# Patient Record
Sex: Female | Born: 1985 | Race: Black or African American | Hispanic: No | Marital: Single | Smoking: Current every day smoker
Health system: Southern US, Community
[De-identification: ages and names within clinical notes are randomized; demographics above are authoritative.]

## PROBLEM LIST (undated history)

## (undated) ENCOUNTER — Inpatient Hospital Stay (HOSPITAL_COMMUNITY): Payer: Self-pay

## (undated) VITALS — BP 114/73 | HR 89 | Temp 97.1°F | Resp 16 | Ht 62.0 in | Wt 192.0 lb

## (undated) DIAGNOSIS — F1020 Alcohol dependence, uncomplicated: Secondary | ICD-10-CM

## (undated) DIAGNOSIS — G43909 Migraine, unspecified, not intractable, without status migrainosus: Secondary | ICD-10-CM

## (undated) DIAGNOSIS — N12 Tubulo-interstitial nephritis, not specified as acute or chronic: Secondary | ICD-10-CM

## (undated) DIAGNOSIS — J45909 Unspecified asthma, uncomplicated: Secondary | ICD-10-CM

## (undated) DIAGNOSIS — F332 Major depressive disorder, recurrent severe without psychotic features: Secondary | ICD-10-CM

## (undated) DIAGNOSIS — R51 Headache: Secondary | ICD-10-CM

## (undated) DIAGNOSIS — B029 Zoster without complications: Secondary | ICD-10-CM

## (undated) DIAGNOSIS — F1220 Cannabis dependence, uncomplicated: Secondary | ICD-10-CM

## (undated) DIAGNOSIS — F419 Anxiety disorder, unspecified: Secondary | ICD-10-CM

## (undated) HISTORY — PX: TEAR DUCT PROBING: SHX793

## (undated) HISTORY — DX: Headache: R51

---

## 2007-01-18 ENCOUNTER — Encounter: Admission: RE | Admit: 2007-01-18 | Discharge: 2007-01-18 | Payer: Self-pay | Admitting: Family Medicine

## 2007-02-07 ENCOUNTER — Emergency Department (HOSPITAL_COMMUNITY): Admission: EM | Admit: 2007-02-07 | Discharge: 2007-02-07 | Payer: Self-pay | Admitting: Family Medicine

## 2008-10-14 ENCOUNTER — Emergency Department (HOSPITAL_COMMUNITY): Admission: EM | Admit: 2008-10-14 | Discharge: 2008-10-15 | Payer: Self-pay | Admitting: Emergency Medicine

## 2010-06-12 LAB — URINALYSIS, ROUTINE W REFLEX MICROSCOPIC
Nitrite: NEGATIVE
Specific Gravity, Urine: 1.034 — ABNORMAL HIGH (ref 1.005–1.030)
pH: 5.5 (ref 5.0–8.0)

## 2010-06-12 LAB — URINE MICROSCOPIC-ADD ON

## 2010-06-12 LAB — URINE CULTURE: Colony Count: 35000

## 2010-07-31 ENCOUNTER — Emergency Department (INDEPENDENT_AMBULATORY_CARE_PROVIDER_SITE_OTHER): Payer: BC Managed Care – PPO

## 2010-07-31 ENCOUNTER — Emergency Department (HOSPITAL_BASED_OUTPATIENT_CLINIC_OR_DEPARTMENT_OTHER)
Admission: EM | Admit: 2010-07-31 | Discharge: 2010-07-31 | Disposition: A | Discharge status: Home / Self Care | Payer: BC Managed Care – PPO | Attending: Emergency Medicine | Admitting: Emergency Medicine

## 2010-07-31 DIAGNOSIS — R0602 Shortness of breath: Secondary | ICD-10-CM

## 2010-07-31 DIAGNOSIS — J45909 Unspecified asthma, uncomplicated: Secondary | ICD-10-CM | POA: Insufficient documentation

## 2010-07-31 DIAGNOSIS — R059 Cough, unspecified: Secondary | ICD-10-CM | POA: Insufficient documentation

## 2010-07-31 DIAGNOSIS — R05 Cough: Secondary | ICD-10-CM | POA: Insufficient documentation

## 2011-08-24 ENCOUNTER — Encounter (HOSPITAL_COMMUNITY): Payer: Self-pay

## 2011-08-24 ENCOUNTER — Inpatient Hospital Stay (HOSPITAL_COMMUNITY)
Admission: AD | Admit: 2011-08-24 | Discharge: 2011-08-24 | Disposition: A | Discharge status: Home / Self Care | Payer: 59 | Source: Ambulatory Visit | Attending: Obstetrics & Gynecology | Admitting: Obstetrics & Gynecology

## 2011-08-24 DIAGNOSIS — Z3202 Encounter for pregnancy test, result negative: Secondary | ICD-10-CM

## 2011-08-24 DIAGNOSIS — N760 Acute vaginitis: Secondary | ICD-10-CM

## 2011-08-24 DIAGNOSIS — O209 Hemorrhage in early pregnancy, unspecified: Secondary | ICD-10-CM

## 2011-08-24 DIAGNOSIS — N949 Unspecified condition associated with female genital organs and menstrual cycle: Secondary | ICD-10-CM | POA: Insufficient documentation

## 2011-08-24 DIAGNOSIS — R109 Unspecified abdominal pain: Secondary | ICD-10-CM | POA: Insufficient documentation

## 2011-08-24 DIAGNOSIS — B9689 Other specified bacterial agents as the cause of diseases classified elsewhere: Secondary | ICD-10-CM

## 2011-08-24 DIAGNOSIS — A499 Bacterial infection, unspecified: Secondary | ICD-10-CM | POA: Insufficient documentation

## 2011-08-24 DIAGNOSIS — N72 Inflammatory disease of cervix uteri: Secondary | ICD-10-CM | POA: Insufficient documentation

## 2011-08-24 HISTORY — DX: Zoster without complications: B02.9

## 2011-08-24 HISTORY — DX: Unspecified asthma, uncomplicated: J45.909

## 2011-08-24 HISTORY — DX: Anxiety disorder, unspecified: F41.9

## 2011-08-24 LAB — URINALYSIS, ROUTINE W REFLEX MICROSCOPIC
Bilirubin Urine: NEGATIVE
Ketones, ur: 15 mg/dL — AB
Leukocytes, UA: NEGATIVE
Protein, ur: 30 mg/dL — AB
Specific Gravity, Urine: >1.03 — ABNORMAL HIGH (ref 1.005–1.030)

## 2011-08-24 LAB — URINE MICROSCOPIC-ADD ON

## 2011-08-24 LAB — WET PREP, GENITAL: Trich, Wet Prep: NONE SEEN

## 2011-08-24 LAB — POCT PREGNANCY, URINE: Preg Test, Ur: NEGATIVE

## 2011-08-24 MED ORDER — METRONIDAZOLE 500 MG PO TABS: 500.0000 mg | ORAL_TABLET | Freq: Two times a day (BID) | ORAL | Status: AC

## 2011-08-24 MED ORDER — AZITHROMYCIN 250 MG PO TABS
1000.0000 mg | ORAL_TABLET | Freq: Once | ORAL | Status: AC
  Administered 2011-08-24: 1000 mg via ORAL
  Filled 2011-08-24: qty 4

## 2011-08-24 NOTE — MAU Note (Signed)
Lower abd pain, started a couple days ago.  Hx of shingles. States rash is long gone (over a year) but the pain continues.  Wk ago  Had flu like symptoms and pain where rash had been, saw dr last wk, BP was elevated- but did not do anything.  Thought abd pain was maybe from ovulation.  Today-  Thought may be saw some blood in toilet, nothing when wiped.

## 2011-08-24 NOTE — MAU Note (Signed)
Pt states regular menstrual cycle started on 08/08/2011 lasting 4-5 days, then began bleeding this am again. Pt states "I do not think this is a period". Intermittent gushes of blood. States she is not filling up a pad. Having lower abd pain as well.

## 2011-08-24 NOTE — MAU Provider Note (Signed)
History     CSN: [id number]  Arrival date & time 08/24/11  1406   None     Chief Complaint  Patient presents with  . Abdominal Pain    HPI Evelyn Fields is a 26 y.o. female who is not pregnant. She presents to MAU for abdominal pain and vaginal discharge. She describes the pain as dull. The pain is located in the mid lower abdomen. She rates the pain as 5/10. The pain is constant. The vaginal discharge started as clear and now bloody. LMP 2 weeks ago, last pap smear > 1 year ago and was normal. Current sex partner x1 month, unprotected. No birth cotrol.  No history of STI's. Recently treated for shingles.  The history was provided by the patient.  Past Medical History  Diagnosis Date  . Asthma   . Shingles   . Anxiety     Past Surgical History  Procedure Date  . 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 Other Neg Hx     History  Substance Use Topics  . Smoking status: Current Everyday Smoker -- 0.5 packs/day    Types: Cigarettes  . Smokeless tobacco: Never Used  . Alcohol Use: 0.0 oz/week     2 drinks daily, more on weekends    OB History    Grav Para Term Preterm Abortions TAB SAB Ect Mult Living   0               Review of Systems  Constitutional: Positive for appetite change. Negative for fever, chills, diaphoresis and fatigue.  HENT: Negative for ear pain, congestion, sore throat, facial swelling, neck pain, neck stiffness, dental problem and sinus pressure.   Eyes: Negative for photophobia, pain, discharge and visual disturbance.  Respiratory: Positive for cough (hx of asthma) and wheezing. Negative for chest tightness.   Cardiovascular: Negative for chest pain, palpitations and leg swelling.  Gastrointestinal: Positive for nausea and abdominal pain. Negative for vomiting, diarrhea, constipation and abdominal distention.  Genitourinary: Positive for vaginal bleeding, vaginal discharge and pelvic pain.  Negative for dysuria, frequency, flank pain and difficulty urinating.  Musculoskeletal: Negative for myalgias, back pain and gait problem.  Skin: Negative for color change and rash.  Neurological: Positive for headaches. Negative for dizziness, speech difficulty, weakness, light-headedness and numbness.  Psychiatric/Behavioral: Negative for confusion and agitation. The patient is not nervous/anxious.     Allergies  Penicillins  Home Medications  No current outpatient prescriptions on file.  BP 124/76  Pulse 96  Temp 98.6 F (37 C) (Oral)  Resp 20  Ht 5' 5.25" (1.657 m)  Wt 175 lb 2 oz (79.436 kg)  BMI 28.92 kg/m2  SpO2 100%  LMP 08/08/2011  Physical Exam  Nursing note and vitals reviewed. Constitutional: She is oriented to person, place, and time. She appears well-developed and well-nourished. No distress.  HENT:  Head: Normocephalic.  Eyes: EOM are normal.  Neck: Neck supple.  Cardiovascular: Normal rate and regular rhythm.   Pulmonary/Chest: Effort normal and breath sounds normal.  Abdominal: Soft. There is tenderness in the suprapubic area. There is no rebound, no guarding and no CVA tenderness.       Mildly tender bilateral lower abdomen.  Genitourinary:       External genitalia without lesions. Mucous blood tinged discharge vaginal vault. Positive CMT, cervix inflamed, minimal tenderness right adnexa. Uterus without palpable enlargement.  Musculoskeletal: Normal range  of motion.  Neurological: She is alert and oriented to person, place, and time. No cranial nerve deficit.  Skin: Skin is warm and dry.  Psychiatric: She has a normal mood and affect. Her behavior is normal. Judgment and thought content normal.   Results for orders placed during the hospital encounter of 08/24/11 (from the past 24 hour(s))  POCT PREGNANCY, URINE     Status: Normal   Collection Time   08/24/11  3:58 PM      Component Value Range   Preg Test, Ur NEGATIVE  NEGATIVE  URINALYSIS, ROUTINE W  REFLEX MICROSCOPIC     Status: Abnormal   Collection Time   08/24/11  7:20 PM      Component Value Range   Color, Urine YELLOW  YELLOW   APPearance CLOUDY (*) CLEAR   Specific Gravity, Urine >1.030 (*) 1.005 - 1.030   pH 6.0  5.0 - 8.0   Glucose, UA NEGATIVE  NEGATIVE mg/dL   Hgb urine dipstick LARGE (*) NEGATIVE   Bilirubin Urine NEGATIVE  NEGATIVE   Ketones, ur 15 (*) NEGATIVE mg/dL   Protein, ur 30 (*) NEGATIVE mg/dL   Urobilinogen, UA 0.2  0.0 - 1.0 mg/dL   Nitrite NEGATIVE  NEGATIVE   Leukocytes, UA NEGATIVE  NEGATIVE  URINE MICROSCOPIC-ADD ON     Status: Abnormal   Collection Time   08/24/11  7:20 PM      Component Value Range   Squamous Epithelial / LPF MANY (*) RARE   WBC, UA 3-6  <3 WBC/hpf   RBC / HPF 21-50  <3 RBC/hpf   Urine-Other MUCOUS PRESENT    WET PREP, GENITAL     Status: Abnormal   Collection Time   08/24/11  8:25 PM      Component Value Range   Yeast Wet Prep HPF POC NONE SEEN  NONE SEEN   Trich, Wet Prep NONE SEEN  NONE SEEN   Clue Cells Wet Prep HPF POC MODERATE (*) NONE SEEN   WBC, Wet Prep HPF POC FEW (*) NONE SEEN   Assessment: Bacterial vaginosis   Cervicitis  Plan:  Rx Flagyl   Zithromax 1 gram po now   Follow up with [facility] ED Course  Procedures  MDM

## 2011-08-24 NOTE — Discharge Instructions (Signed)
Bacterial Vaginosis Bacterial vaginosis (BV) is a vaginal infection where the normal balance of bacteria in the vagina is disrupted. The normal balance is then replaced by an overgrowth of certain bacteria. There are several different kinds of bacteria that can cause BV. BV is the most common vaginal infection in women of childbearing age. CAUSES   The cause of BV is not fully understood. BV develops when there is an increase or imbalance of harmful bacteria.   Some activities or behaviors can upset the normal balance of bacteria in the vagina and put women at increased risk including:   Having a new sex partner or multiple sex partners.   Douching.   Using an intrauterine device (IUD) for contraception.   It is not clear what role sexual activity plays in the development of BV. However, women that have never had sexual intercourse are rarely infected with BV.  Women do not get BV from toilet seats, bedding, swimming pools or from touching objects around them.  SYMPTOMS   Grey vaginal discharge.   A fish-like odor with discharge, especially after sexual intercourse.   Itching or burning of the vagina and vulva.   Burning or pain with urination.   Some women have no signs or symptoms at all.  DIAGNOSIS  Your caregiver must examine the vagina for signs of BV. Your caregiver will perform lab tests and look at the sample of vaginal fluid through a microscope. They will look for bacteria and abnormal cells (clue cells), a pH test higher than 4.5, and a positive amine test all associated with BV.  RISKS AND COMPLICATIONS   Pelvic inflammatory disease (PID).   Infections following gynecology surgery.   Developing HIV.   Developing herpes virus.  TREATMENT  Sometimes BV will clear up without treatment. However, all women with symptoms of BV should be treated to avoid complications, especially if gynecology surgery is planned. Female partners generally do not need to be treated. However,  BV may spread between female sex partners so treatment is helpful in preventing a recurrence of BV.   BV may be treated with antibiotics. The antibiotics come in either pill or vaginal cream forms. Either can be used with nonpregnant or pregnant women, but the recommended dosages differ. These antibiotics are not harmful to the baby.   BV can recur after treatment. If this happens, a second round of antibiotics will often be prescribed.   Treatment is important for pregnant women. If not treated, BV can cause a premature delivery, especially for a pregnant woman who had a premature birth in the past. All pregnant women who have symptoms of BV should be checked and treated.   For chronic reoccurrence of BV, treatment with a type of prescribed gel vaginally twice a week is helpful.  HOME CARE INSTRUCTIONS   Finish all medication as directed by your caregiver.   Do not have sex until treatment is completed.   Tell your sexual partner that you have a vaginal infection. They should see their caregiver and be treated if they have problems, such as a mild rash or itching.   Practice safe sex. Use condoms. Only have 1 sex partner.  PREVENTION  Basic prevention steps can help reduce the risk of upsetting the natural balance of bacteria in the vagina and developing BV:  Do not have sexual intercourse (be abstinent).   Do not douche.   Use all of the medicine prescribed for treatment of BV, even if the signs and symptoms go away.     Tell your sex partner if you have BV. That way, they can be treated, if needed, to prevent reoccurrence.  SEEK MEDICAL CARE IF:   Your symptoms are not improving after 3 days of treatment.   You have increased discharge, pain, or fever.  MAKE SURE YOU:   Understand these instructions.   Will watch your condition.   Will get help right away if you are not doing well or get worse.  FOR MORE INFORMATION  Division of STD Prevention (DSTDP), Centers for Disease  Control and Prevention: SolutionApps.co.za American Social Health Association (ASHA): www.ashastd.org  Document Released: 02/20/2005 Document Revised: 02/09/2011 Document Reviewed: 08/13/2008 [facility] Patient Information 2012 [place], [place].Cervicitis Cervicitis is a soreness and puffiness (inflammation) of the cervix. The cervix is at the bottom of the uterus. Your doctor will do an exam to find the cause. Your treatment will depend on the cause. If the cause is a sexual infection, you and your partner both need treatment. HOME CARE  Do not have sex (intercourse) until your doctor says it is okay.   Do not have sex until your partner is treated if your doctor told you not to.   Take medicines (antibiotics) as told. Finish them even if you start to feel better.  GET HELP RIGHT AWAY IF:   Your problems come back.   You have a fever.   You have problems that may be caused by your medicine.  MAKE SURE YOU:   Understand these instructions.   Will watch your condition.   Will get help right away if you are not doing well or get worse.  Document Released: 11/30/2007 Document Revised: 02/09/2011 Document Reviewed: 09/19/2010 [facility] Patient Information 2012 [place], [place].

## 2011-08-25 LAB — GC/CHLAMYDIA PROBE AMP, GENITAL: GC Probe Amp, Genital: POSITIVE — AB

## 2011-08-30 ENCOUNTER — Telehealth: Payer: Self-pay | Admitting: *Deleted

## 2011-08-30 NOTE — Telephone Encounter (Signed)
Message copied by Gerome Apley on Wed Aug 30, 2011 12:17 PM ------      Message from: Darrel Hoover      Created: Tue Aug 29, 2011 11:23 AM      Regarding: Please call patient                   ----- Message -----         From: Allie Bossier, MD         Sent: 08/29/2011   9:16 AM           To: Juliette Mangle, RN            She needs to be treated with rocephin 250 mg IM once and her partner needs to be referred to the health dept for treatment of gonorrhea as well.      Thanks

## 2011-08-30 NOTE — Telephone Encounter (Signed)
Called patient home number and heard message this person is not accepting calls at this time.

## 2011-08-31 NOTE — Telephone Encounter (Signed)
Also called patient contact number and left a message to please have [facility] call clinic for some important information,

## 2011-08-31 NOTE — Telephone Encounter (Signed)
Called patient , unable to leave a message, heard message person is not receiving calls at this time. Filled out STD sheet and sent to health department

## 2012-02-05 ENCOUNTER — Emergency Department (HOSPITAL_COMMUNITY)
Admission: EM | Admit: 2012-02-05 | Discharge: 2012-02-05 | Disposition: A | Discharge status: Home / Self Care | Payer: 59 | Source: Home / Self Care | Attending: Family Medicine | Admitting: Family Medicine

## 2012-02-05 ENCOUNTER — Emergency Department (HOSPITAL_COMMUNITY)
Admission: EM | Admit: 2012-02-05 | Discharge: 2012-02-06 | Disposition: A | Discharge status: Home / Self Care | Payer: 59 | Attending: Emergency Medicine | Admitting: Emergency Medicine

## 2012-02-05 ENCOUNTER — Encounter (HOSPITAL_COMMUNITY): Payer: Self-pay | Admitting: *Deleted

## 2012-02-05 ENCOUNTER — Encounter (HOSPITAL_COMMUNITY): Payer: Self-pay | Admitting: Emergency Medicine

## 2012-02-05 DIAGNOSIS — R109 Unspecified abdominal pain: Secondary | ICD-10-CM | POA: Insufficient documentation

## 2012-02-05 DIAGNOSIS — F411 Generalized anxiety disorder: Secondary | ICD-10-CM | POA: Insufficient documentation

## 2012-02-05 DIAGNOSIS — N898 Other specified noninflammatory disorders of vagina: Secondary | ICD-10-CM | POA: Insufficient documentation

## 2012-02-05 DIAGNOSIS — Z79899 Other long term (current) drug therapy: Secondary | ICD-10-CM | POA: Insufficient documentation

## 2012-02-05 DIAGNOSIS — R112 Nausea with vomiting, unspecified: Secondary | ICD-10-CM | POA: Insufficient documentation

## 2012-02-05 DIAGNOSIS — Z791 Long term (current) use of non-steroidal anti-inflammatories (NSAID): Secondary | ICD-10-CM | POA: Insufficient documentation

## 2012-02-05 DIAGNOSIS — N390 Urinary tract infection, site not specified: Secondary | ICD-10-CM | POA: Insufficient documentation

## 2012-02-05 DIAGNOSIS — F17200 Nicotine dependence, unspecified, uncomplicated: Secondary | ICD-10-CM | POA: Insufficient documentation

## 2012-02-05 DIAGNOSIS — R350 Frequency of micturition: Secondary | ICD-10-CM | POA: Insufficient documentation

## 2012-02-05 DIAGNOSIS — R1031 Right lower quadrant pain: Secondary | ICD-10-CM

## 2012-02-05 DIAGNOSIS — J45909 Unspecified asthma, uncomplicated: Secondary | ICD-10-CM | POA: Insufficient documentation

## 2012-02-05 LAB — COMPREHENSIVE METABOLIC PANEL
ALT: 40 U/L — ABNORMAL HIGH (ref 0–35)
AST: 33 U/L (ref 0–37)
Albumin: 3.8 g/dL (ref 3.5–5.2)
Alkaline Phosphatase: 94 U/L (ref 39–117)
BUN: 11 mg/dL (ref 6–23)
CO2: 26 mEq/L (ref 19–32)
Calcium: 9.5 mg/dL (ref 8.4–10.5)
Chloride: 103 mEq/L (ref 96–112)
Creatinine, Ser: 0.67 mg/dL (ref 0.50–1.10)
GFR calc Af Amer: >90 mL/min (ref >90)
GFR calc non Af Amer: >90 mL/min (ref >90)
Glucose, Bld: 95 mg/dL (ref 70–99)
Potassium: 3.6 mEq/L (ref 3.5–5.1)
Sodium: 139 mEq/L (ref 135–145)
Total Bilirubin: 0.3 mg/dL (ref 0.3–1.2)
Total Protein: 7.4 g/dL (ref 6.0–8.3)

## 2012-02-05 LAB — URINALYSIS, ROUTINE W REFLEX MICROSCOPIC
Bilirubin Urine: NEGATIVE
Glucose, UA: NEGATIVE mg/dL
Ketones, ur: NEGATIVE mg/dL
Nitrite: NEGATIVE
Protein, ur: NEGATIVE mg/dL
Specific Gravity, Urine: 1.037 — ABNORMAL HIGH (ref 1.005–1.030)
Urobilinogen, UA: 0.2 mg/dL (ref 0.0–1.0)
pH: 5.5 (ref 5.0–8.0)

## 2012-02-05 LAB — CBC WITH DIFFERENTIAL/PLATELET
Basophils Absolute: 0 10*3/uL (ref 0.0–0.1)
Basophils Relative: 0 % (ref 0–1)
Eosinophils Absolute: 0 10*3/uL (ref 0.0–0.7)
Eosinophils Relative: 0 % (ref 0–5)
HCT: 35.2 % — ABNORMAL LOW (ref 36.0–46.0)
Hemoglobin: 11.9 g/dL — ABNORMAL LOW (ref 12.0–15.0)
Lymphocytes Relative: 17 % (ref 12–46)
Lymphs Abs: 1.8 10*3/uL (ref 0.7–4.0)
MCH: 28.3 pg (ref 26.0–34.0)
MCHC: 33.8 g/dL (ref 30.0–36.0)
MCV: 83.8 fL (ref 78.0–100.0)
Monocytes Absolute: 0.7 10*3/uL (ref 0.1–1.0)
Monocytes Relative: 7 % (ref 3–12)
Neutro Abs: 7.6 10*3/uL (ref 1.7–7.7)
Neutrophils Relative %: 75 % (ref 43–77)
Platelets: 350 10*3/uL (ref 150–400)
RBC: 4.2 MIL/uL (ref 3.87–5.11)
RDW: 13 % (ref 11.5–15.5)
WBC: 10.1 10*3/uL (ref 4.0–10.5)

## 2012-02-05 LAB — POCT URINALYSIS DIP (DEVICE)
Glucose, UA: NEGATIVE mg/dL
Leukocytes, UA: NEGATIVE
Nitrite: NEGATIVE
Urobilinogen, UA: 0.2 mg/dL (ref 0.0–1.0)
pH: 5.5 (ref 5.0–8.0)

## 2012-02-05 LAB — URINE MICROSCOPIC-ADD ON

## 2012-02-05 LAB — POCT PREGNANCY, URINE: Preg Test, Ur: NEGATIVE

## 2012-02-05 LAB — LIPASE, BLOOD: Lipase: 17 U/L (ref 11–59)

## 2012-02-05 NOTE — ED Provider Notes (Signed)
History     CSN: [id number]  Arrival date & time 02/05/12  1759   First MD Initiated Contact with Patient 02/05/12 1844      Chief Complaint  Patient presents with  . Abdominal Pain    (Consider location/radiation/quality/duration/timing/severity/associated sxs/prior treatment) Patient is a 26 y.o. female presenting with abdominal pain. The history is provided by the patient.  Abdominal Pain The primary symptoms of the illness include abdominal pain, nausea and vomiting. The current episode started more than 2 days ago. The onset of the illness was gradual.  The abdominal pain has been gradually worsening since its onset. The abdominal pain is located in the periumbilical region. The abdominal pain radiates to the suprapubic region. The severity of the abdominal pain is 8/10. The abdominal pain is relieved by nothing. The abdominal pain is exacerbated by certain positions.  The illness is associated with alcohol use. The patient states that she believes she is currently not pregnant. The patient has not had a change in bowel habit. Symptoms associated with the illness do not include chills, anorexia, diaphoresis, heartburn, constipation, urgency, hematuria, frequency or back pain.    Past Medical History  Diagnosis Date  . Asthma   . Shingles   . Anxiety     Past Surgical History  Procedure Date  . 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 Other Neg Hx     History  Substance Use Topics  . Smoking status: Current Every Day Smoker -- 0.5 packs/day    Types: Cigarettes  . Smokeless tobacco: Never Used  . Alcohol Use: 0.0 oz/week     Comment: 2 drinks daily, more on weekends    OB History    Grav Para Term Preterm Abortions TAB SAB Ect Mult Living   0               Review of Systems  Constitutional: Negative for chills and diaphoresis.  Gastrointestinal: Positive for nausea, vomiting and abdominal pain.  Negative for heartburn, constipation and anorexia.  Genitourinary: Negative for urgency, frequency and hematuria.  Musculoskeletal: Negative for back pain.  All other systems reviewed and are negative.    Allergies  Penicillins  Home Medications   Current Outpatient Rx  Name  Route  Sig  Dispense  Refill  . ALBUTEROL SULFATE HFA 108 (90 BASE) MCG/ACT IN AERS   Inhalation   Inhale 2 puffs into the lungs every 4 (four) hours as needed. For asthma         . NITROFURANTOIN MONOHYD MACRO 100 MG PO CAPS   Oral   Take 1 capsule (100 mg total) by mouth 2 (two) times daily. X 7 days   14 capsule   0   . TRAMADOL HCL 50 MG PO TABS   Oral   Take 1 tablet (50 mg total) by mouth every 6 (six) hours as needed for pain.   15 tablet   0     BP 122/70  Pulse 103  Temp 98.9 F (37.2 C) (Oral)  Resp 12  SpO2 100%  Physical Exam  Nursing note and vitals reviewed. Constitutional: She is oriented to person, place, and time. Vital signs are normal. She appears well-developed and well-nourished. She is active and cooperative.  HENT:  Head: Normocephalic.  Eyes: Conjunctivae normal are normal. Pupils are equal, round, and reactive to light. No scleral icterus.  Neck: Trachea normal and  normal range of motion. Neck supple.  Cardiovascular: Regular rhythm, normal heart sounds, intact distal pulses and normal pulses.  Tachycardia present.   Pulmonary/Chest: Effort normal and breath sounds normal.  Abdominal: Soft. Normal appearance. Bowel sounds are increased. There is no hepatosplenomegaly. There is tenderness in the right lower quadrant, periumbilical area and suprapubic area. There is rebound. There is no CVA tenderness.  Neurological: She is alert and oriented to person, place, and time. No cranial nerve deficit or sensory deficit.  Skin: Skin is warm and dry.  Psychiatric: She has a normal mood and affect. Her speech is normal and behavior is normal. Judgment and thought content normal.  Cognition and memory are normal.    ED Course  Procedures (including critical care time)  Labs Reviewed  POCT URINALYSIS DIP (DEVICE) - Abnormal; Notable for the following:    Hgb urine dipstick TRACE (*)     All other components within normal limits  POCT PREGNANCY, URINE  LAB REPORT - SCANNED   No results found.   1. RLQ abdominal pain       MDM  Negative urine pregnancy, urine dip trace hgb, pt denies vaginal discharge or suspicious STD symptoms.   Transfer to [facility] for further evaluation of acute abdomen pain.        Johnsie Kindred, NP 02/06/12 1559

## 2012-02-05 NOTE — ED Notes (Signed)
Reports abd pain since Saturday.  Admits to nausea and vomiting.   Patient says it feels as something is pulling in her stomach.  Has tried OTC medication.  Denies constapation.

## 2012-02-05 NOTE — ED Notes (Signed)
Pt reports N/V and abdominal pain since Saturday.  Reports severe abdominal pain in the middle of her abdomen.  Denies pregnancy.  LBM-today normal.  LMP-3 weeks ago.

## 2012-02-06 LAB — WET PREP, GENITAL: Yeast Wet Prep HPF POC: NONE SEEN

## 2012-02-06 MED ORDER — TRAMADOL HCL 50 MG PO TABS: 50.0000 mg | ORAL_TABLET | Freq: Four times a day (QID) | ORAL | Status: DC | PRN

## 2012-02-06 MED ORDER — HYDROCODONE-ACETAMINOPHEN 5-325 MG PO TABS
1.0000 | ORAL_TABLET | Freq: Once | ORAL | Status: AC
  Administered 2012-02-06: 1 via ORAL
  Filled 2012-02-06: qty 1

## 2012-02-06 MED ORDER — NITROFURANTOIN MONOHYD MACRO 100 MG PO CAPS: 100.0000 mg | ORAL_CAPSULE | Freq: Two times a day (BID) | ORAL | Status: DC

## 2012-02-06 MED ORDER — CEFTRIAXONE SODIUM 250 MG IJ SOLR
250.0000 mg | Freq: Once | INTRAMUSCULAR | Status: AC
  Administered 2012-02-06: 250 mg via INTRAMUSCULAR
  Filled 2012-02-06: qty 250

## 2012-02-06 MED ORDER — LIDOCAINE HCL (PF) 1 % IJ SOLN
INTRAMUSCULAR | Status: AC
  Filled 2012-02-06: qty 5

## 2012-02-06 MED ORDER — AZITHROMYCIN 250 MG PO TABS
1000.0000 mg | ORAL_TABLET | Freq: Once | ORAL | Status: AC
  Administered 2012-02-06: 1000 mg via ORAL
  Filled 2012-02-06: qty 4

## 2012-02-06 NOTE — ED Provider Notes (Signed)
Medical screening examination/treatment/procedure(s) were performed by resident physician or non-physician practitioner and as supervising physician I was immediately available for consultation/collaboration.   Barkley Bruns MD.    Linna Hoff, MD 02/06/12 [phone]

## 2012-02-07 ENCOUNTER — Emergency Department (HOSPITAL_COMMUNITY): Payer: 59

## 2012-02-07 ENCOUNTER — Emergency Department (HOSPITAL_COMMUNITY)
Admission: EM | Admit: 2012-02-07 | Discharge: 2012-02-07 | Disposition: A | Discharge status: Home / Self Care | Payer: 59 | Attending: Emergency Medicine | Admitting: Emergency Medicine

## 2012-02-07 ENCOUNTER — Emergency Department (HOSPITAL_BASED_OUTPATIENT_CLINIC_OR_DEPARTMENT_OTHER)
Admission: EM | Admit: 2012-02-07 | Discharge: 2012-02-07 | Discharge status: Against Medical Advice | Payer: 59 | Attending: Emergency Medicine | Admitting: Emergency Medicine

## 2012-02-07 ENCOUNTER — Encounter (HOSPITAL_BASED_OUTPATIENT_CLINIC_OR_DEPARTMENT_OTHER): Payer: Self-pay | Admitting: *Deleted

## 2012-02-07 ENCOUNTER — Encounter (HOSPITAL_COMMUNITY): Payer: Self-pay

## 2012-02-07 DIAGNOSIS — Z3202 Encounter for pregnancy test, result negative: Secondary | ICD-10-CM | POA: Insufficient documentation

## 2012-02-07 DIAGNOSIS — R109 Unspecified abdominal pain: Secondary | ICD-10-CM | POA: Insufficient documentation

## 2012-02-07 DIAGNOSIS — Z79899 Other long term (current) drug therapy: Secondary | ICD-10-CM | POA: Insufficient documentation

## 2012-02-07 DIAGNOSIS — Z872 Personal history of diseases of the skin and subcutaneous tissue: Secondary | ICD-10-CM | POA: Insufficient documentation

## 2012-02-07 DIAGNOSIS — F17200 Nicotine dependence, unspecified, uncomplicated: Secondary | ICD-10-CM | POA: Insufficient documentation

## 2012-02-07 DIAGNOSIS — J45909 Unspecified asthma, uncomplicated: Secondary | ICD-10-CM | POA: Insufficient documentation

## 2012-02-07 DIAGNOSIS — R42 Dizziness and giddiness: Secondary | ICD-10-CM | POA: Insufficient documentation

## 2012-02-07 DIAGNOSIS — R51 Headache: Secondary | ICD-10-CM | POA: Insufficient documentation

## 2012-02-07 DIAGNOSIS — N898 Other specified noninflammatory disorders of vagina: Secondary | ICD-10-CM | POA: Insufficient documentation

## 2012-02-07 DIAGNOSIS — F411 Generalized anxiety disorder: Secondary | ICD-10-CM | POA: Insufficient documentation

## 2012-02-07 LAB — URINE CULTURE
Colony Count: NO GROWTH
Culture: NO GROWTH

## 2012-02-07 LAB — URINALYSIS, ROUTINE W REFLEX MICROSCOPIC
Bilirubin Urine: NEGATIVE
Glucose, UA: NEGATIVE mg/dL
Hgb urine dipstick: NEGATIVE
Ketones, ur: NEGATIVE mg/dL
pH: 6 (ref 5.0–8.0)

## 2012-02-07 LAB — CBC WITH DIFFERENTIAL/PLATELET
Eosinophils Relative: 0 % (ref 0–5)
HCT: 39 % (ref 36.0–46.0)
Lymphocytes Relative: 22 % (ref 12–46)
Lymphs Abs: 1.8 10*3/uL (ref 0.7–4.0)
MCV: 83.2 fL (ref 78.0–100.0)
Monocytes Absolute: 0.6 10*3/uL (ref 0.1–1.0)
RBC: 4.69 MIL/uL (ref 3.87–5.11)
RDW: 12.6 % (ref 11.5–15.5)
WBC: 8.3 10*3/uL (ref 4.0–10.5)

## 2012-02-07 LAB — COMPREHENSIVE METABOLIC PANEL
BUN: 9 mg/dL (ref 6–23)
CO2: 23 mEq/L (ref 19–32)
Chloride: 103 mEq/L (ref 96–112)
Creatinine, Ser: 0.75 mg/dL (ref 0.50–1.10)
GFR calc non Af Amer: >90 mL/min (ref >90)
Total Bilirubin: 0.3 mg/dL (ref 0.3–1.2)

## 2012-02-07 LAB — POCT PREGNANCY, URINE: Preg Test, Ur: NEGATIVE

## 2012-02-07 LAB — GC/CHLAMYDIA PROBE AMP: CT Probe RNA: NEGATIVE

## 2012-02-07 MED ORDER — OXYCODONE-ACETAMINOPHEN 5-325 MG PO TABS: 1.0000 | ORAL_TABLET | ORAL | Status: DC | PRN

## 2012-02-07 MED ORDER — OXYCODONE-ACETAMINOPHEN 5-325 MG PO TABS
2.0000 | ORAL_TABLET | Freq: Once | ORAL | Status: AC
  Administered 2012-02-07: 2 via ORAL
  Filled 2012-02-07: qty 2

## 2012-02-07 MED ORDER — DOXYCYCLINE HYCLATE 100 MG PO CAPS: 100.0000 mg | ORAL_CAPSULE | Freq: Two times a day (BID) | ORAL | Status: DC

## 2012-02-07 MED ORDER — METRONIDAZOLE 500 MG PO TABS: 500.0000 mg | ORAL_TABLET | Freq: Two times a day (BID) | ORAL | Status: DC

## 2012-02-07 MED ORDER — ONDANSETRON 4 MG PO TBDP
4.0000 mg | ORAL_TABLET | Freq: Once | ORAL | Status: AC
  Administered 2012-02-07: 4 mg via ORAL
  Filled 2012-02-07: qty 1

## 2012-02-07 MED ORDER — PROMETHAZINE HCL 25 MG PO TABS: 25.0000 mg | ORAL_TABLET | Freq: Four times a day (QID) | ORAL | Status: DC | PRN

## 2012-02-07 NOTE — ED Notes (Addendum)
Pt c/o abd pain seen at [facility] x 2 days ago for same dx uti , pt did not get her prescriptions filled , pt states she doesn't have a UTI , pt states" vicodin doesn't work so tramadol wont work"

## 2012-02-07 NOTE — ED Provider Notes (Signed)
Patient placed in CDU by Doug Sou for further evaluation of her abdominal pain.  Patient is here for right lower part abdominal pain seen 2 days ago at [facility] urgent care for the same and diagnosed with UTI for which she did not fill her antibiotics and has received labs, imaging and anti-emetics.   Plan per previous provider is to await the results of her abdominal/pelvic ultrasound since her GC Chlamydia probe was positive for gonorrhea.  We'll change her antibiotic to doxycycline and DC home if no evidence of abscess..  Patient re-evaluated and is resting comfortably, VSS, with no new complaints or concerns at this time.  On exam: hemodynamically stable, NAD, heart w/ RRR, lungs CTAB, Chest & abd non-tender, no peripheral edema or calf tenderness.  BP 121/75  Pulse 83  Temp 98.7 F (37.1 C) (Oral)  Resp 17  SpO2 99%  LMP 02/05/2012  Discussed with patient current lab and imaging results as well as their care plan, patient questions answered.  Patient is amenable to the plan.   11:02 PM Patient with mild abdominal pain at this time.  We'll treat with pain control at this time Ultrasound without evidence of abscess or other mass.  Discussed these things with the patient. I discussed at length the need for her to fill her antibiotics and take them at this time because of the ramifications that an STD and/or PID can have on her future fertility.   have also discussed reasons to return immediately to the ER.  Patient expresses understanding and agrees with plan.  1. Medications: Doxycycline, Flagyl, Percocet, Phenergan, usual home medications 2. Treatment: rest, drink plenty of fluids, take medications as directed 3. Follow Up: Please followup with your primary doctor for discussion of your diagnoses and further evaluation after today's visit; if you do not have a primary care doctor use the resource guide provided to find one; make appointment with GYN for persistent pain   Dierdre Forth, PA-C 02/07/12 2310

## 2012-02-07 NOTE — ED Notes (Signed)
Pt sts she has abd pain, Pt sts her face feels like there is a lot of pressure on it.  Pt complains of dizziness

## 2012-02-07 NOTE — ED Provider Notes (Signed)
History     CSN: [id number]  Arrival date & time 02/07/12  1521   First MD Initiated Contact with Patient 02/07/12 2009      Chief Complaint  Patient presents with  . Abdominal Pain  . Facial Pain  . Dizziness    (Consider location/radiation/quality/duration/timing/severity/associated sxs/prior treatment) HPI Complains of right lower quadrant pain onset 4 days ago, constant nonradiating patient seen at [facility] 2 days ago and seen here 02/06/2012. Treated with ceftriaxone and Zithromax cervical culture determined to be positive for gonorrhea. States pain is not improved patient is vomited twice today has had slight amount of vaginal bleeding onset 2 days ago, denies fever denies other complaint no nausea at present no anorexia. Patient has treated herself with Vicodin one tablet every 6 hours without relief. Nothing makes symptoms better or worse. Patient states she felt pressure on her face earlier today which has since resolved. Face feels normal at present. Past Medical History  Diagnosis Date  . Asthma   . Shingles   . Anxiety     Past Surgical History  Procedure Date  . 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 Other Neg Hx     History  Substance Use Topics  . Smoking status: Current Every Day Smoker -- 0.5 packs/day    Types: Cigarettes  . Smokeless tobacco: Never Used  . Alcohol Use: 0.0 oz/week     Comment: 2 drinks daily, more on weekends    OB History    Grav Para Term Preterm Abortions TAB SAB Ect Mult Living   0               Review of Systems  Gastrointestinal: Positive for abdominal pain.  Genitourinary: Positive for vaginal bleeding.  All other systems reviewed and are negative.    Allergies  Penicillins  Home Medications   Current Outpatient Rx  Name  Route  Sig  Dispense  Refill  . ALBUTEROL SULFATE HFA 108 (90 BASE) MCG/ACT IN AERS   Inhalation   Inhale 2  puffs into the lungs every 4 (four) hours as needed. For asthma         . NAPROXEN SODIUM 220 MG PO TABS   Oral   Take 220 mg by mouth 2 (two) times daily with a meal. For stomach pain.         Marland Kitchen NITROFURANTOIN MONOHYD MACRO 100 MG PO CAPS   Oral   Take 1 capsule (100 mg total) by mouth 2 (two) times daily. X 7 days   14 capsule   0   . TRAMADOL HCL 50 MG PO TABS   Oral   Take 1 tablet (50 mg total) by mouth every 6 (six) hours as needed for pain.   15 tablet   0     BP 112/69  Pulse 93  Temp 98.7 F (37.1 C) (Oral)  Resp 18  SpO2 100%  LMP 02/05/2012  Physical Exam  Nursing note and vitals reviewed. Constitutional: She appears well-developed and well-nourished.  HENT:  Head: Normocephalic and atraumatic.  Eyes: Conjunctivae normal are normal. Pupils are equal, round, and reactive to light.  Neck: Neck supple. No tracheal deviation present. No thyromegaly present.  Cardiovascular: Normal rate and regular rhythm.   No murmur heard. Pulmonary/Chest: Effort normal and breath sounds normal.  Abdominal: Soft. Bowel sounds are normal. She exhibits no distension.  There is tenderness.       Tender right lower quadrant  Genitourinary:       No external lesion cervical os closed slight amount of blood in vault positive cervical motion tenderness positive right adnexal tenderness no masses  Musculoskeletal: Normal range of motion. She exhibits no edema and no tenderness.  Neurological: She is alert. Coordination normal.  Skin: Skin is warm and dry. No rash noted.  Psychiatric: She has a normal mood and affect.    ED Course  Procedures (including critical care time)  Labs Reviewed  URINALYSIS, ROUTINE W REFLEX MICROSCOPIC - Abnormal; Notable for the following:    APPearance HAZY (*)     All other components within normal limits  COMPREHENSIVE METABOLIC PANEL - Abnormal; Notable for the following:    ALT 40 (*)     Alkaline Phosphatase 120 (*)     All other components  within normal limits  CBC WITH DIFFERENTIAL  POCT PREGNANCY, URINE   No results found.   No diagnosis found.  Results for orders placed during the hospital encounter of 02/07/12  CBC WITH DIFFERENTIAL      Component Value Range   WBC 8.3  4.0 - 10.5 K/uL   RBC 4.69  3.87 - 5.11 MIL/uL   Hemoglobin 13.2  12.0 - 15.0 g/dL   HCT 40.9  81.1 - 91.4 %   MCV 83.2  78.0 - 100.0 fL   MCH 28.1  26.0 - 34.0 pg   MCHC 33.8  30.0 - 36.0 g/dL   RDW 78.2  95.6 - 21.3 %   Platelets 378  150 - 400 K/uL   Neutrophils Relative 70  43 - 77 %   Neutro Abs 5.8  1.7 - 7.7 K/uL   Lymphocytes Relative 22  12 - 46 %   Lymphs Abs 1.8  0.7 - 4.0 K/uL   Monocytes Relative 8  3 - 12 %   Monocytes Absolute 0.6  0.1 - 1.0 K/uL   Eosinophils Relative 0  0 - 5 %   Eosinophils Absolute 0.0  0.0 - 0.7 K/uL   Basophils Relative 1  0 - 1 %   Basophils Absolute 0.0  0.0 - 0.1 K/uL  URINALYSIS, ROUTINE W REFLEX MICROSCOPIC      Component Value Range   Color, Urine YELLOW  YELLOW   APPearance HAZY (*) CLEAR   Specific Gravity, Urine 1.022  1.005 - 1.030   pH 6.0  5.0 - 8.0   Glucose, UA NEGATIVE  NEGATIVE mg/dL   Hgb urine dipstick NEGATIVE  NEGATIVE   Bilirubin Urine NEGATIVE  NEGATIVE   Ketones, ur NEGATIVE  NEGATIVE mg/dL   Protein, ur NEGATIVE  NEGATIVE mg/dL   Urobilinogen, UA 0.2  0.0 - 1.0 mg/dL   Nitrite NEGATIVE  NEGATIVE   Leukocytes, UA NEGATIVE  NEGATIVE  COMPREHENSIVE METABOLIC PANEL      Component Value Range   Sodium 138  135 - 145 mEq/L   Potassium 4.1  3.5 - 5.1 mEq/L   Chloride 103  96 - 112 mEq/L   CO2 23  19 - 32 mEq/L   Glucose, Bld 97  70 - 99 mg/dL   BUN 9  6 - 23 mg/dL   Creatinine, Ser 0.86  0.50 - 1.10 mg/dL   Calcium 9.9  8.4 - 57.8 mg/dL   Total Protein 7.9  6.0 - 8.3 g/dL   Albumin 4.2  3.5 - 5.2 g/dL   AST 33  0 -  37 U/L   ALT 40 (*) 0 - 35 U/L   Alkaline Phosphatase 120 (*) 39 - 117 U/L   Total Bilirubin 0.3  0.3 - 1.2 mg/dL   GFR calc non Af Amer >90  >90 mL/min    GFR calc Af Amer >90  >90 mL/min  POCT PREGNANCY, URINE      Component Value Range   Preg Test, Ur NEGATIVE  NEGATIVE   US Transvaginal Non-ob  02/07/2012  *RADIOLOGY REPORT*  Clinical Data:  Pelvic pain and adnexal tenderness.  Clinical suspicion for ovarian torsion. LMP 3 weeks ago.  TRANSABDOMINAL AND TRANSVAGINAL ULTRASOUND OF PELVIS DOPPLER ULTRASOUND OF OVARIES  Technique:  Both transabdominal and transvaginal ultrasound examinations of the pelvis were performed. Transabdominal technique was performed for global imaging of the pelvis including uterus, ovaries, adnexal regions, and pelvic cul-de-sac.  It was necessary to proceed with endovaginal exam following the transabdominal exam to visualize the endometrium and ovaries.  Color and duplex Doppler ultrasound was utilized to evaluate blood flow to the ovaries.  Comparison:  None.  Findings:  Uterus:  7.1 x 3.2 x 3.4 cm.  No fibroids other uterine mass identified.  Endometrium:  Double layer thickness measures 5 mm transvaginally. No focal lesion visualized.  Right ovary: 4.3 x 3.2 x 3.3 cm.  A corpus luteum is seen within the right ovary measuring 1.9 cm.  Blood flow is seen within the right ovary on color Doppler ultrasound.  Left ovary:   2.9 x 1.4 x 2.1 cm.  Normal appearance.  Blood flow is seen within the left ovary color Doppler ultrasound.  Pulsed Doppler evaluation demonstrates normal low-resistance arterial and venous waveforms in both ovaries.  IMPRESSION:  1.  1.9 cm right ovarian corpus luteum. 2.  Normal appearance of the left ovary and uterus. 3.  No evidence of ovarian torsion.   Original Report Authenticated By: Myles Rosenthal, M.D.    US Pelvis Complete  02/07/2012  *RADIOLOGY REPORT*  Clinical Data:  Pelvic pain and adnexal tenderness.  Clinical suspicion for ovarian torsion. LMP 3 weeks ago.  TRANSABDOMINAL AND TRANSVAGINAL ULTRASOUND OF PELVIS DOPPLER ULTRASOUND OF OVARIES  Technique:  Both transabdominal and transvaginal ultrasound  examinations of the pelvis were performed. Transabdominal technique was performed for global imaging of the pelvis including uterus, ovaries, adnexal regions, and pelvic cul-de-sac.  It was necessary to proceed with endovaginal exam following the transabdominal exam to visualize the endometrium and ovaries.  Color and duplex Doppler ultrasound was utilized to evaluate blood flow to the ovaries.  Comparison:  None.  Findings:  Uterus:  7.1 x 3.2 x 3.4 cm.  No fibroids other uterine mass identified.  Endometrium:  Double layer thickness measures 5 mm transvaginally. No focal lesion visualized.  Right ovary: 4.3 x 3.2 x 3.3 cm.  A corpus luteum is seen within the right ovary measuring 1.9 cm.  Blood flow is seen within the right ovary on color Doppler ultrasound.  Left ovary:   2.9 x 1.4 x 2.1 cm.  Normal appearance.  Blood flow is seen within the left ovary color Doppler ultrasound.  Pulsed Doppler evaluation demonstrates normal low-resistance arterial and venous waveforms in both ovaries.  IMPRESSION:  1.  1.9 cm right ovarian corpus luteum. 2.  Normal appearance of the left ovary and uterus. 3.  No evidence of ovarian torsion.   Original Report Authenticated By: Myles Rosenthal, M.D.    [place] Art/ven Flow Abd Pelv Doppler  02/07/2012  *RADIOLOGY REPORT*  Clinical Data:  Pelvic pain and adnexal tenderness.  Clinical suspicion for ovarian torsion. LMP 3 weeks ago.  TRANSABDOMINAL AND TRANSVAGINAL ULTRASOUND OF PELVIS DOPPLER ULTRASOUND OF OVARIES  Technique:  Both transabdominal and transvaginal ultrasound examinations of the pelvis were performed. Transabdominal technique was performed for global imaging of the pelvis including uterus, ovaries, adnexal regions, and pelvic cul-de-sac.  It was necessary to proceed with endovaginal exam following the transabdominal exam to visualize the endometrium and ovaries.  Color and duplex Doppler ultrasound was utilized to evaluate blood flow to the ovaries.  Comparison:  None.   Findings:  Uterus:  7.1 x 3.2 x 3.4 cm.  No fibroids other uterine mass identified.  Endometrium:  Double layer thickness measures 5 mm transvaginally. No focal lesion visualized.  Right ovary: 4.3 x 3.2 x 3.3 cm.  A corpus luteum is seen within the right ovary measuring 1.9 cm.  Blood flow is seen within the right ovary on color Doppler ultrasound.  Left ovary:   2.9 x 1.4 x 2.1 cm.  Normal appearance.  Blood flow is seen within the left ovary color Doppler ultrasound.  Pulsed Doppler evaluation demonstrates normal low-resistance arterial and venous waveforms in both ovaries.  IMPRESSION:  1.  1.9 cm right ovarian corpus luteum. 2.  Normal appearance of the left ovary and uterus. 3.  No evidence of ovarian torsion.   Original Report Authenticated By: Myles Rosenthal, M.D.      MDM  Doubt appendicitis in light of symptoms lasting 4 days, no leukocytosis no fever symptoms likely consistent with PID in light of recent positive cervical swab for gonorrhea        Doug Sou, MD 02/08/12 0139

## 2012-02-08 NOTE — ED Provider Notes (Signed)
Medical screening examination/treatment/procedure(s) were conducted as a shared visit with non-physician practitioner(s) and myself.  I personally evaluated the patient during the encounter  Doug Sou, MD 02/08/12 [phone]

## 2012-02-09 NOTE — ED Notes (Signed)
+  Gonorrhea. Chart sent to EDP office for review. 

## 2012-02-09 NOTE — ED Provider Notes (Signed)
History     CSN: [id number]  Arrival date & time 02/05/12  1909   First MD Initiated Contact with Patient 02/05/12 2224      Chief Complaint  Patient presents with  . Abdominal Pain   HPI  History provided by the patient. Patient is a 26 year old female with history of asthma and anxiety who presents with complaints of lower abdominal pain and discomforts for the past 2-3 days. Symptoms have been associated with some episodes of nausea and vomiting. Patient denies having any fever or chills at home. Denies any diarrhea or constipation. Patient was seen and evaluated for symptoms this prior to arrival at the urgent care Center. She was sent to the emergency room for further evaluation. Pain is primary located in the periumbilical and suprapubic regions. Patient denies having any dysuria but does report some increased urinary frequency. She also reports some mild vaginal discharge. Patient is currently sexually active and reports only using protection.    Past Medical History  Diagnosis Date  . Asthma   . Shingles   . Anxiety     Past Surgical History  Procedure Date  . 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 Other Neg Hx     History  Substance Use Topics  . Smoking status: Current Every Day Smoker -- 0.5 packs/day    Types: Cigarettes  . Smokeless tobacco: Never Used  . Alcohol Use: 0.0 oz/week     Comment: 2 drinks daily, more on weekends    OB History    Grav Para Term Preterm Abortions TAB SAB Ect Mult Living   0               Review of Systems  Constitutional: Negative for fever and chills.  Gastrointestinal: Positive for nausea, vomiting and abdominal pain.  Genitourinary: Positive for frequency and vaginal discharge. Negative for dysuria and vaginal bleeding.  Skin: Negative for rash.  All other systems reviewed and are negative.    Allergies  Penicillins  Home Medications   Current  Outpatient Rx  Name  Route  Sig  Dispense  Refill  . ALBUTEROL SULFATE HFA 108 (90 BASE) MCG/ACT IN AERS   Inhalation   Inhale 2 puffs into the lungs every 4 (four) hours as needed. For asthma         . DOXYCYCLINE HYCLATE 100 MG PO CAPS   Oral   Take 1 capsule (100 mg total) by mouth 2 (two) times daily.   28 capsule   0   . METRONIDAZOLE 500 MG PO TABS   Oral   Take 1 tablet (500 mg total) by mouth 2 (two) times daily.   14 tablet   0   . NAPROXEN SODIUM 220 MG PO TABS   Oral   Take 220 mg by mouth 2 (two) times daily with a meal. For stomach pain.         Marland Kitchen NITROFURANTOIN MONOHYD MACRO 100 MG PO CAPS   Oral   Take 1 capsule (100 mg total) by mouth 2 (two) times daily. X 7 days   14 capsule   0   . OXYCODONE-ACETAMINOPHEN 5-325 MG PO TABS   Oral   Take 1 tablet by mouth every 4 (four) hours as needed for pain.   12 tablet   0   . PROMETHAZINE HCL 25 MG PO TABS   Oral   Take  1 tablet (25 mg total) by mouth every 6 (six) hours as needed for nausea.   12 tablet   0   . TRAMADOL HCL 50 MG PO TABS   Oral   Take 1 tablet (50 mg total) by mouth every 6 (six) hours as needed for pain.   15 tablet   0     BP 125/80  Pulse 99  Temp 98.6 F (37 C) (Oral)  Resp 16  SpO2 100%  Physical Exam  Nursing note and vitals reviewed. Constitutional: She is oriented to person, place, and time. She appears well-developed and well-nourished. No distress.  HENT:  Head: Normocephalic.  Cardiovascular: Normal rate and regular rhythm.   Pulmonary/Chest: Effort normal and breath sounds normal. No respiratory distress. She has no wheezes. She has no rales.  Abdominal: Soft. There is tenderness in the right lower quadrant, suprapubic area and left lower quadrant. There is no rebound, no guarding, no CVA tenderness, no tenderness at McBurney's point and negative Murphy's sign.  Genitourinary: Cervix exhibits discharge and friability. Cervix exhibits no motion tenderness. Right  adnexum displays tenderness. Right adnexum displays no mass. Left adnexum displays tenderness. Left adnexum displays no mass.       Chaperone was present. Mild diffuse tenderness on exam.  Neurological: She is alert and oriented to person, place, and time.  Skin: Skin is warm and dry. No rash noted.  Psychiatric: She has a normal mood and affect. Her behavior is normal.    ED Course  Procedures   Labs Reviewed  CBC WITH DIFFERENTIAL - Abnormal; Notable for the following:    Hemoglobin 11.9 (*)     HCT 35.2 (*)     All other components within normal limits  COMPREHENSIVE METABOLIC PANEL - Abnormal; Notable for the following:    ALT 40 (*)     All other components within normal limits  URINALYSIS, ROUTINE W REFLEX MICROSCOPIC - Abnormal; Notable for the following:    APPearance CLOUDY (*)     Specific Gravity, Urine 1.037 (*)     Hgb urine dipstick MODERATE (*)     Leukocytes, UA MODERATE (*)     All other components within normal limits  GC/CHLAMYDIA PROBE AMP - Abnormal; Notable for the following:    GC Probe RNA POSITIVE (*)     All other components within normal limits  WET PREP, GENITAL - Abnormal; Notable for the following:    Clue Cells Wet Prep HPF POC FEW (*)     WBC, Wet Prep HPF POC TOO NUMEROUS TO COUNT (*)     All other components within normal limits  URINE MICROSCOPIC-ADD ON - Abnormal; Notable for the following:    Squamous Epithelial / LPF MANY (*)     Bacteria, UA MANY (*)     All other components within normal limits  LIPASE, BLOOD  URINE CULTURE  LAB REPORT - SCANNED     1. Abdominal pain   2. UTI (lower urinary tract infection)       MDM  Patient seen and evaluated. Patient appears in mild discomfort no acute distress.  Urine concerning for UTI. Patient also with some concerning vaginal discharge. IM shot of ceftriaxone given as well as 1 g of azithromycin for possible gonorrhea chlamydia. Patient also given prescriptions for Macrobid and Tylenol  for pain. Patient advised to inform all social partners of possible positive STD test.      Angus Seller, PA 02/09/12 [phone]

## 2012-02-10 NOTE — ED Provider Notes (Signed)
Medical screening examination/treatment/procedure(s) were performed by non-physician practitioner and as supervising physician I was immediately available for consultation/collaboration.  Raeford Razor, MD 02/10/12 [phone]

## 2012-02-11 NOTE — ED Notes (Deleted)
Chart returned from EDP office. Per Teressa Lower NP, Patient treated in ED.

## 2012-02-16 ENCOUNTER — Telehealth (HOSPITAL_COMMUNITY): Payer: Self-pay | Admitting: Emergency Medicine

## 2012-02-17 ENCOUNTER — Telehealth (HOSPITAL_COMMUNITY): Payer: Self-pay | Admitting: Emergency Medicine

## 2012-02-18 ENCOUNTER — Telehealth (HOSPITAL_COMMUNITY): Payer: Self-pay | Admitting: Emergency Medicine

## 2012-02-19 NOTE — ED Notes (Signed)
Chart returned from EDP office. Per Teressa Lower NP, patient treated in ED.

## 2012-02-19 NOTE — ED Notes (Signed)
Unable to contact patient via phone. Sent letter. °

## 2012-02-25 NOTE — ED Notes (Signed)
Patient treated per protocol. Chart sent to Medical Records.

## 2012-08-06 ENCOUNTER — Ambulatory Visit (INDEPENDENT_AMBULATORY_CARE_PROVIDER_SITE_OTHER): Payer: 59 | Admitting: Neurology

## 2012-08-06 ENCOUNTER — Encounter: Payer: Self-pay | Admitting: Neurology

## 2012-08-06 VITALS — BP 121/73 | HR 79 | Ht 66.0 in | Wt 193.0 lb

## 2012-08-06 DIAGNOSIS — G43909 Migraine, unspecified, not intractable, without status migrainosus: Secondary | ICD-10-CM

## 2012-08-06 DIAGNOSIS — B029 Zoster without complications: Secondary | ICD-10-CM

## 2012-08-06 DIAGNOSIS — M79609 Pain in unspecified limb: Secondary | ICD-10-CM

## 2012-08-06 MED ORDER — GABAPENTIN 300 MG PO CAPS: 600.0000 mg | ORAL_CAPSULE | Freq: Three times a day (TID) | ORAL | Status: DC

## 2012-08-06 MED ORDER — SUMATRIPTAN SUCCINATE 100 MG PO TABS: 100.0000 mg | ORAL_TABLET | ORAL | Status: DC | PRN

## 2012-08-06 NOTE — Progress Notes (Signed)
HPI:  Evelyn Fields is a 26 year right AAF, referred by her primary care physician Dr Andi Devon for evalution of migraine, post herpetic neuralgia.  Reported a history of migraine headaches since 2012, more typical migraine of bilateral frontal retro-orbital area severe pounding headache with associated light noise sensitivity, lasting for a few days, nauseous,  She only has occasionally migraines, but with increased frequency since her shingles outbreak  The first shingle was in August 2012, involving right mid thoracic spine, she has vesicles broke out,  lasting for couple weeks, but post herpetic neuralgic pain lasting for 3 months, she was treated by Dr. Andi Devon, was getting prescription of Valtrex, later tramadol for pain  She had a second episode of shingles in January 2014, she noticed rash broke out at left ulnar forearm, blisters hypersensitivity to touch, she was again treated with Valtrex, hydrocodone, gabapentin 100 mg twice a day, which did help her pain, lasting about one to 2 months  Since January 2014, she began to have more frequent migraine,   In May 2014, she noticed recurrent rash in the similar territory as January, vesicles, hypersensitivity to touch, constant headaches, she was put on Valtrex, 500 mg twice a day, was instructed to take a long-term, also as a shingle prevention,   She now complains of left medial arm numbness, needle prick sensation, sensitivities,  She was seen by dermatologist, Dr. Nita Sells in May 15th,  Who has suggested him to stop Valtrex,500mg  bid, instead of taking two large does, 2000mg  bid, x 2days.    She also stop Gabapentin, she is taking Ibuprofen 800mg  prn, Her pain is not under optimal control   Review of Systems  Out of a complete 14 system review, the patient complains of only the following symptoms, and all other reviewed systems are negative.   Constitutional:   Fever, chills, weight gain Cardiovascular:  N/A Ear/Nose/Throat:  N/A Skin:  Rash itching Eyes: N/A Respiratory: N/A Gastroitestinal: N/A    Hematology/Lymphatic:  N/A Endocrine:  Feeling hot, increased thirst, Musculoskeletal: Joint pain, achy muscles  Allergy/Immunology: skin sensitivity  Neurological: Headaches numbness , weakness, insomnia passing out,, Psychiatric:   depression , anxiety , not enough sleep , decreased energy , disinterested in activities , racing thoughts   PHYSICAL EXAMINATOINS:  Generalized: In no acute distress  Neck: Supple, no carotid bruits   Cardiac: Regular rate rhythm  Pulmonary: Clear to auscultation bilaterally  Musculoskeletal: No deformity  Neurological examination  Mentation: Alert oriented to time, place, history taking, and causual conversation  Cranial nerve II-XII: Pupils were equal round reactive to light extraocular movements were full, visual field were full on confrontational test. facial sensation and strength were normal. hearing was intact to finger rubbing bilaterally. Uvula tongue midline.  head turning and shoulder shrug and were normal and symmetric.Tongue protrusion into cheek strength was normal.  Motor: normal tone, bulk and strength, mild limitation of left arm due to pain, healed left forearm scar  Sensory: hypersensitivity at left c7, c8, t1, t2 myotomes.  Coordination: Normal finger to nose, heel-to-shin bilaterally there was no truncal ataxia  Gait: Rising up from seated position without assistance, normal stance, without trunk ataxia, moderate stride, good arm swing, smooth turning, able to perform tiptoe, and heel walking without difficulty.   Romberg signs: Negative  Deep tendon reflexes: Brachioradialis 2/2, biceps 2/2, triceps 2/2, patellar 2/2, Achilles 2/2, plantar responses were flexor bilaterally.  Assessment and plan:  27 years old right-handed [place] American female, with recurrent left arm shingles,  involving left C7,  C8, T1 myotomes,  1 gabapentin titrating to 300 mg 2  tablets 3 times a day. 2. continue valtrex 500 mg twice a day  3. imitrex as need for headaches. 4. RTC in one month

## 2012-08-08 DIAGNOSIS — G43909 Migraine, unspecified, not intractable, without status migrainosus: Secondary | ICD-10-CM | POA: Insufficient documentation

## 2012-08-08 DIAGNOSIS — B029 Zoster without complications: Secondary | ICD-10-CM | POA: Insufficient documentation

## 2012-08-08 DIAGNOSIS — M79609 Pain in unspecified limb: Secondary | ICD-10-CM | POA: Insufficient documentation

## 2012-08-08 HISTORY — DX: Migraine, unspecified, not intractable, without status migrainosus: G43.909

## 2012-08-13 DIAGNOSIS — Z0289 Encounter for other administrative examinations: Secondary | ICD-10-CM

## 2012-08-14 ENCOUNTER — Other Ambulatory Visit: Payer: Self-pay

## 2012-08-14 MED ORDER — GABAPENTIN 300 MG PO CAPS: 600.0000 mg | ORAL_CAPSULE | Freq: Three times a day (TID) | ORAL | Status: DC

## 2012-08-15 ENCOUNTER — Telehealth: Payer: Self-pay | Admitting: Neurology

## 2012-08-15 NOTE — Telephone Encounter (Signed)
Patient is calling to tell Dr. Terrace Arabia the new HA medication she's been prescribed is making her migranes worse.  She tells me she is having insomnia at night, can't sleep with this new med.  Also, she dropped off her disability forms the other day and needs the dates under our care to be 08-06-2012 to 08-16-2012.  She needs this paper work back no later than next week.  She asks to please give her a call at:  [phone]

## 2012-08-16 ENCOUNTER — Telehealth: Payer: Self-pay

## 2012-08-16 NOTE — Telephone Encounter (Signed)
I fail to reach her and could not leave a message either, she should take gabapentin 300mg  ii tid for her neuropathic pain, potential side effect includes sleepiness, fatigue.

## 2012-08-16 NOTE — Telephone Encounter (Signed)
I returned the patients call to get more information from her about her head ache complaints. Patient states that she was put on Gabapentin and was to take two tablets per day. She said the Doctor said she should not take more than two a day so she though, 'Oh my. This must be very strong", so she decided to take only one. She reports her symptoms have worsened from the medication. She also reports that she continues to have shingles outbreak.   I discussed with her rationale for taking medication as directed on the label. I also encouraged her to ensure good hydration.   I also discussed with patient her Medical Leave forms. She states that she continues to have a shingles outbreak and that is why she is requesting the extension on her leave. Patient was in bed and sleeping when I called her and was wakened by an adult female in her home. She indicated she was having a migraine when I called her.  I will have Dr. Terrace Arabia advise and will call patient back.

## 2012-08-16 NOTE — Telephone Encounter (Signed)
See next note

## 2012-08-16 NOTE — Telephone Encounter (Signed)
I attempted to contact this patient. Unable to leave message on VM. Message states, The wireless customer your are trying to reach is not available. Please try your call later.

## 2012-08-20 NOTE — Telephone Encounter (Signed)
I again attempted to call patient without success. I will close this call at this time.

## 2012-08-22 ENCOUNTER — Telehealth: Payer: Self-pay

## 2012-08-22 NOTE — Telephone Encounter (Signed)
I called patient at an alternate phone number which she recorded relating to FMLA forms. I left info. To take medication as ordered and forms were faxed today.

## 2012-09-13 ENCOUNTER — Emergency Department (HOSPITAL_COMMUNITY)
Admission: EM | Admit: 2012-09-13 | Discharge: 2012-09-14 | Disposition: A | Discharge status: Home / Self Care | Payer: 59 | Attending: Emergency Medicine | Admitting: Emergency Medicine

## 2012-09-13 ENCOUNTER — Encounter (HOSPITAL_COMMUNITY): Payer: Self-pay | Admitting: Adult Health

## 2012-09-13 DIAGNOSIS — Z79899 Other long term (current) drug therapy: Secondary | ICD-10-CM | POA: Insufficient documentation

## 2012-09-13 DIAGNOSIS — F17200 Nicotine dependence, unspecified, uncomplicated: Secondary | ICD-10-CM | POA: Insufficient documentation

## 2012-09-13 DIAGNOSIS — F3289 Other specified depressive episodes: Secondary | ICD-10-CM | POA: Insufficient documentation

## 2012-09-13 DIAGNOSIS — J45909 Unspecified asthma, uncomplicated: Secondary | ICD-10-CM | POA: Insufficient documentation

## 2012-09-13 DIAGNOSIS — F411 Generalized anxiety disorder: Secondary | ICD-10-CM | POA: Insufficient documentation

## 2012-09-13 DIAGNOSIS — F329 Major depressive disorder, single episode, unspecified: Secondary | ICD-10-CM | POA: Insufficient documentation

## 2012-09-13 DIAGNOSIS — G43909 Migraine, unspecified, not intractable, without status migrainosus: Secondary | ICD-10-CM | POA: Insufficient documentation

## 2012-09-13 DIAGNOSIS — Z8619 Personal history of other infectious and parasitic diseases: Secondary | ICD-10-CM | POA: Insufficient documentation

## 2012-09-13 DIAGNOSIS — Z880 Allergy status to penicillin: Secondary | ICD-10-CM | POA: Insufficient documentation

## 2012-09-13 LAB — CBC
Platelets: 379 10*3/uL (ref 150–400)
RBC: 4.68 MIL/uL (ref 3.87–5.11)
RDW: 13.1 % (ref 11.5–15.5)
WBC: 8 10*3/uL (ref 4.0–10.5)

## 2012-09-13 LAB — RAPID URINE DRUG SCREEN, HOSP PERFORMED
Barbiturates: NOT DETECTED
Cocaine: NOT DETECTED

## 2012-09-13 LAB — POCT PREGNANCY, URINE: Preg Test, Ur: NEGATIVE

## 2012-09-13 LAB — COMPREHENSIVE METABOLIC PANEL
Alkaline Phosphatase: 90 U/L (ref 39–117)
BUN: 7 mg/dL (ref 6–23)
GFR calc Af Amer: >90 mL/min (ref >90)
Glucose, Bld: 95 mg/dL (ref 70–99)
Potassium: 4.1 mEq/L (ref 3.5–5.1)
Sodium: 138 mEq/L (ref 135–145)
Total Protein: 7.6 g/dL (ref 6.0–8.3)

## 2012-09-13 LAB — ETHANOL: Alcohol, Ethyl (B): <11 mg/dL (ref 0–11)

## 2012-09-13 MED ORDER — IBUPROFEN 400 MG PO TABS: 600.0000 mg | ORAL_TABLET | Freq: Three times a day (TID) | ORAL | Status: DC | PRN

## 2012-09-13 MED ORDER — NICOTINE 21 MG/24HR TD PT24
21.0000 mg | MEDICATED_PATCH | Freq: Once | TRANSDERMAL | Status: DC
  Administered 2012-09-13: 21 mg via TRANSDERMAL
  Filled 2012-09-13: qty 1

## 2012-09-13 MED ORDER — ZOLPIDEM TARTRATE 5 MG PO TABS: 5.0000 mg | ORAL_TABLET | Freq: Every evening | ORAL | Status: DC | PRN

## 2012-09-13 MED ORDER — ACETAMINOPHEN 325 MG PO TABS: 650.0000 mg | ORAL_TABLET | ORAL | Status: DC | PRN

## 2012-09-13 NOTE — ED Notes (Signed)
ACT team in room with pt

## 2012-09-13 NOTE — ED Notes (Signed)
Presents with one week of feeling down and inability to complete ADLs, she states that , "I hate that I am here. I feel like a loser. I have a plan to kill myself. But then I worry if I do it wrong I will look stupid" pt is tearful and requesting help. "I just feel like a loser. I just don't know where this coming from. It is just not me" reports thoughts of hurting others as well.

## 2012-09-13 NOTE — ED Provider Notes (Addendum)
History    CSN: [id number] Arrival date & time 09/13/12  1619/07/23  First MD Initiated Contact with Patient 09/13/12 1716     Chief Complaint  Patient presents with  . Medical Clearance   (Consider location/radiation/quality/duration/timing/severity/associated sxs/prior Treatment) The history is provided by the patient.   is reports a 4-5 months of worsening feelings of depression, decreased energy.  She's been treating her depression with alcohol daily.  She states she does not drink to the point of blacking out.  She reports daily marijuana use as well.Father died in Jul 22, 2008 she's had severe symptoms since then.  She's been following with her primary care doctor and has been on Celexa without improvement in her symptoms.  She presents for assistance today.  She does report suicidal thoughts without a significant plan.  She is very tearful on exam. Past Medical History  Diagnosis Date  . Asthma   . Shingles 2010-07-23, Jul 22, 2012   left arm  . Anxiety   . Headache(784.0)   . Migraine   . Anxiety    Past Surgical History  Procedure Laterality Date  . 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 Other Neg Hx    History  Substance Use Topics  . Smoking status: Current Every Day Smoker -- 0.50 packs/day    Types: Cigarettes  . Smokeless tobacco: Never Used  . Alcohol Use: 0.0 oz/week     Comment: 2 drinks daily, more on weekends,quit 2012/07/22   OB History   Grav Para Term Preterm Abortions TAB SAB Ect Mult Living   0              Review of Systems  All other systems reviewed and are negative.    Allergies  Imitrex and Penicillins  Home Medications   Current Outpatient Rx  Name  Route  Sig  Dispense  Refill  . albuterol (PROVENTIL HFA;VENTOLIN HFA) 108 (90 BASE) MCG/ACT inhaler   Inhalation   Inhale 2 puffs into the lungs every 4 (four) hours as needed. For asthma         . gabapentin (NEURONTIN) 300 MG capsule    Oral   Take 2 capsules (600 mg total) by mouth 3 (three) times daily.   180 capsule   6   . Melatonin 3 MG TABS   Oral   Take 2 tablets by mouth at bedtime as needed. For sleep          BP 124/62  Pulse 97  Temp(Src) 98.9 F (37.2 C) (Oral)  Resp 18  SpO2 99% Physical Exam  Nursing note and vitals reviewed. Constitutional: She is oriented to person, place, and time. She appears well-developed and well-nourished. No distress.  HENT:  Head: Normocephalic and atraumatic.  Eyes: EOM are normal.  Neck: Normal range of motion.  Cardiovascular: Normal rate, regular rhythm and normal heart sounds.   Pulmonary/Chest: Effort normal and breath sounds normal.  Abdominal: Soft. She exhibits no distension. There is no tenderness.  Musculoskeletal: Normal range of motion.  Neurological: She is alert and oriented to person, place, and time.  Skin: Skin is warm and dry.  Psychiatric: Her speech is normal. Judgment normal. She is withdrawn. Cognition and memory are normal. She exhibits a depressed mood. She expresses suicidal ideation.    ED Course  Procedures (including critical care time) Labs Reviewed  COMPREHENSIVE METABOLIC PANEL - Abnormal; Notable for  the following:    Total Bilirubin 0.1 (*)    All other components within normal limits  SALICYLATE LEVEL - Abnormal; Notable for the following:    Salicylate Lvl <2.0 (*)    All other components within normal limits  URINE RAPID DRUG SCREEN (HOSP PERFORMED) - Abnormal; Notable for the following:    Tetrahydrocannabinol POSITIVE (*)    All other components within normal limits  ACETAMINOPHEN LEVEL  CBC  ETHANOL  POCT PREGNANCY, URINE   No results found. 1. Depression     MDM  ACT team to evaluate for placement for severe depression  Lyanne Co, MD 09/13/12 2001  12:25 AM To behavioral health team has had a prolonged discussion with the patient.  At this time the patient would like to go home and she'll be set up for  aggressive outpatient management by behavioral health.  The patient understands return to the ER for new or worsening symptoms.  The patient is insightful and her partner is with her who agrees that outpatient management is reasonable.  The patient states she will not drink alcohol or smoke her want to the weekend and she'll followup as instructed.  She understands that she is welcome to return at any time.  She has Celexa at home and will restart this  Lyanne Co, MD 09/14/12 1610  Lyanne Co, MD 09/14/12 [phone]

## 2012-09-13 NOTE — ED Notes (Signed)
Pt dinner tray reordered

## 2012-09-13 NOTE — ED Notes (Signed)
Pt has black purse and navy blue sweat shirt with her. She stated that she does not have any weapons with her. Given paperwork from Dr office for referral to come to the ED for Psych Eval.

## 2012-09-13 NOTE — ED Notes (Signed)
Pt made multiple attempts to call friend without success.  Requested to talk to ACT team.  ACT team in room with pt
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
## 2012-09-16 ENCOUNTER — Encounter (HOSPITAL_COMMUNITY): Payer: Self-pay | Admitting: *Deleted

## 2012-09-16 ENCOUNTER — Encounter (HOSPITAL_COMMUNITY): Payer: Self-pay

## 2012-09-16 ENCOUNTER — Emergency Department (HOSPITAL_COMMUNITY)
Admission: EM | Admit: 2012-09-16 | Discharge: 2012-09-16 | Disposition: A | Discharge status: Other Acute Inpatient Hospital | Payer: 59 | Attending: Emergency Medicine | Admitting: Emergency Medicine

## 2012-09-16 DIAGNOSIS — F1010 Alcohol abuse, uncomplicated: Secondary | ICD-10-CM | POA: Insufficient documentation

## 2012-09-16 DIAGNOSIS — J45909 Unspecified asthma, uncomplicated: Secondary | ICD-10-CM | POA: Insufficient documentation

## 2012-09-16 DIAGNOSIS — R5383 Other fatigue: Secondary | ICD-10-CM | POA: Insufficient documentation

## 2012-09-16 DIAGNOSIS — F3289 Other specified depressive episodes: Secondary | ICD-10-CM | POA: Insufficient documentation

## 2012-09-16 DIAGNOSIS — Z0289 Encounter for other administrative examinations: Secondary | ICD-10-CM | POA: Insufficient documentation

## 2012-09-16 DIAGNOSIS — F17200 Nicotine dependence, unspecified, uncomplicated: Secondary | ICD-10-CM | POA: Insufficient documentation

## 2012-09-16 DIAGNOSIS — Z79899 Other long term (current) drug therapy: Secondary | ICD-10-CM | POA: Insufficient documentation

## 2012-09-16 DIAGNOSIS — G4700 Insomnia, unspecified: Secondary | ICD-10-CM | POA: Insufficient documentation

## 2012-09-16 DIAGNOSIS — R632 Polyphagia: Secondary | ICD-10-CM | POA: Insufficient documentation

## 2012-09-16 DIAGNOSIS — F411 Generalized anxiety disorder: Secondary | ICD-10-CM | POA: Insufficient documentation

## 2012-09-16 DIAGNOSIS — Z8679 Personal history of other diseases of the circulatory system: Secondary | ICD-10-CM | POA: Insufficient documentation

## 2012-09-16 DIAGNOSIS — R5381 Other malaise: Secondary | ICD-10-CM | POA: Insufficient documentation

## 2012-09-16 DIAGNOSIS — Z880 Allergy status to penicillin: Secondary | ICD-10-CM | POA: Insufficient documentation

## 2012-09-16 DIAGNOSIS — F329 Major depressive disorder, single episode, unspecified: Secondary | ICD-10-CM

## 2012-09-16 DIAGNOSIS — F1210 Cannabis abuse, uncomplicated: Secondary | ICD-10-CM | POA: Insufficient documentation

## 2012-09-16 DIAGNOSIS — Z3202 Encounter for pregnancy test, result negative: Secondary | ICD-10-CM | POA: Insufficient documentation

## 2012-09-16 DIAGNOSIS — R45851 Suicidal ideations: Secondary | ICD-10-CM | POA: Insufficient documentation

## 2012-09-16 LAB — COMPREHENSIVE METABOLIC PANEL
AST: 26 U/L (ref 0–37)
Albumin: 3.9 g/dL (ref 3.5–5.2)
Alkaline Phosphatase: 90 U/L (ref 39–117)
BUN: 7 mg/dL (ref 6–23)
Chloride: 104 mEq/L (ref 96–112)
Potassium: 4.2 mEq/L (ref 3.5–5.1)
Sodium: 139 mEq/L (ref 135–145)
Total Bilirubin: 0.3 mg/dL (ref 0.3–1.2)
Total Protein: 7.4 g/dL (ref 6.0–8.3)

## 2012-09-16 LAB — CBC
HCT: 38.7 % (ref 36.0–46.0)
MCHC: 33.3 g/dL (ref 30.0–36.0)
Platelets: 391 10*3/uL (ref 150–400)
RDW: 13 % (ref 11.5–15.5)
WBC: 8.2 10*3/uL (ref 4.0–10.5)

## 2012-09-16 LAB — RAPID URINE DRUG SCREEN, HOSP PERFORMED
Amphetamines: NOT DETECTED
Benzodiazepines: NOT DETECTED
Opiates: NOT DETECTED
Tetrahydrocannabinol: POSITIVE — AB

## 2012-09-16 LAB — ETHANOL: Alcohol, Ethyl (B): <11 mg/dL (ref 0–11)

## 2012-09-16 LAB — POCT PREGNANCY, URINE: Preg Test, Ur: NEGATIVE

## 2012-09-16 NOTE — ED Notes (Signed)
Pt has a ready bed a [facility], paperwork faxed to the unit

## 2012-09-16 NOTE — ED Notes (Signed)
Pt was seen at [facility] Friday and attempted to do outpatient, can't wait for outpatient treatment, pt here for medical clearance

## 2012-09-16 NOTE — ED Notes (Signed)
Bed ready at [facility], Aurther Loft will have voluntary paperwork signed.

## 2012-09-16 NOTE — ED Notes (Signed)
Pt signed voluntary paperwork and is ready for transport to [facility]

## 2012-09-16 NOTE — ED Provider Notes (Signed)
History    CSN: [id number] Arrival date & time 09/16/12  7829  First MD Initiated Contact with Patient 09/16/12 Aug 11, 2121     Chief Complaint  Patient presents with  . Medical Clearance   HPI Pt was seen at Aug 11, 2133.  Per pt, c/o gradual onset and worsening of persistent depression and SI for the past several years, worse over the past several weeks. Describes her symptoms as fatigue, loss of interest, insomnia, over eating, and tearfulness. Pt states she drinks alcohol and smokes marijuana daily. States her symptoms worsened in 08/11/08 after a family member died.  States she saw her PMD 7 months ago, rx celexa but "stopped taking it because it didn't work." Endorses vague SI.  Denies plan. Denies SA, no HI. Pt was evaluated at [facility] and sent to the ED for medical clearance pending bed placement.    Past Medical History  Diagnosis Date  . Asthma   . Shingles 08-12-2010, 08-11-12   left arm  . Anxiety   . Headache(784.0)   . Migraine   . Anxiety   . Depression    Past Surgical History  Procedure Laterality Date  . 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 Other Neg Hx    History  Substance Use Topics  . Smoking status: Current Every Day Smoker -- 1.00 packs/day    Types: Cigarettes  . Smokeless tobacco: Never Used  . Alcohol Use: 10.8 oz/week    6 Cans of beer, 7 Shots of liquor, 5 Glasses of wine per week     Comment: 2 drinks daily, more on weekends,quit Aug 11, 2012    Review of Systems ROS: Statement: All systems negative except as marked or noted in the HPI; Constitutional: Negative for fever and chills. ; ; Eyes: Negative for eye pain, redness and discharge. ; ; ENMT: Negative for ear pain, hoarseness, nasal congestion, sinus pressure and sore throat. ; ; Cardiovascular: Negative for chest pain, palpitations, diaphoresis, dyspnea and peripheral edema. ; ; Respiratory: Negative for cough, wheezing and stridor. ; ;  Gastrointestinal: Negative for nausea, vomiting, diarrhea, abdominal pain, blood in stool, hematemesis, jaundice and rectal bleeding. . ; ; Genitourinary: Negative for dysuria, flank pain and hematuria. ; ; Musculoskeletal: Negative for back pain and neck pain. Negative for swelling and trauma.; ; Skin: Negative for pruritus, rash, abrasions, blisters, bruising and skin lesion.; ; Neuro: Negative for headache, lightheadedness and neck stiffness. Negative for weakness, altered level of consciousness , altered mental status, extremity weakness, paresthesias, involuntary movement, seizure and syncope.; Psych:  +depression, +vague SI. No SA, no HI, no hallucinations.      Allergies  Imitrex and Penicillins  Home Medications   Current Outpatient Rx  Name  Route  Sig  Dispense  Refill  . albuterol (PROVENTIL HFA;VENTOLIN HFA) 108 (90 BASE) MCG/ACT inhaler   Inhalation   Inhale 2 puffs into the lungs every 4 (four) hours as needed. For asthma         . citalopram (CELEXA) 10 MG tablet   Oral   Take 10 mg by mouth daily.         Marland Kitchen gabapentin (NEURONTIN) 300 MG capsule   Oral   Take 2 capsules (600 mg total) by mouth 3 (three) times daily.   180 capsule   6   . Melatonin 3 MG TABS   Oral   Take 2 tablets  by mouth at bedtime as needed. For sleep          BP 140/76  Pulse 96  Temp(Src) 99.4 F (37.4 C) (Oral)  Resp 18  Ht 5' 5.75" (1.67 m)  Wt 195 lb (88.451 kg)  BMI 31.72 kg/m2  SpO2 99%  LMP 08/28/2012 Physical Exam 2140: Physical examination:  Nursing notes reviewed; Vital signs and O2 SAT reviewed;  Constitutional: Well developed, Well nourished, Well hydrated, In no acute distress; Head:  Normocephalic, atraumatic; Eyes: EOMI, PERRL, No scleral icterus; ENMT: Mouth and pharynx normal, Mucous membranes moist; Neck: Supple, Full range of motion, No lymphadenopathy; Cardiovascular: Regular rate and rhythm, No murmur, rub, or gallop; Respiratory: Breath sounds clear & equal  bilaterally, No rales, rhonchi, wheezes.  Speaking full sentences with ease, Normal respiratory effort/excursion; Chest: Nontender, Movement normal; Abdomen: Soft, Nontender, Nondistended, Normal bowel sounds; Genitourinary: No CVA tenderness; Extremities: Pulses normal, No tenderness, No edema, No calf edema or asymmetry.; Neuro: AA&Ox3, Major CN grossly intact.  Speech clear. Gait steady. No gross focal motor or sensory deficits in extremities.; Skin: Color normal, Warm, Dry.; Psych:  +depression, vague SI. Affect flat, poor eye contact.    ED Course  Procedures     MDM  MDM Reviewed: previous chart, nursing note and vitals Interpretation: labs   Results for orders placed during the hospital encounter of 09/16/12  CBC      Result Value Range   WBC 8.2  4.0 - 10.5 K/uL   RBC 4.63  3.87 - 5.11 MIL/uL   Hemoglobin 12.9  12.0 - 15.0 g/dL   HCT 40.9  81.1 - 91.4 %   MCV 83.6  78.0 - 100.0 fL   MCH 27.9  26.0 - 34.0 pg   MCHC 33.3  30.0 - 36.0 g/dL   RDW 78.2  95.6 - 21.3 %   Platelets 391  150 - 400 K/uL  COMPREHENSIVE METABOLIC PANEL      Result Value Range   Sodium 139  135 - 145 mEq/L   Potassium 4.2  3.5 - 5.1 mEq/L   Chloride 104  96 - 112 mEq/L   CO2 26  19 - 32 mEq/L   Glucose, Bld 99  70 - 99 mg/dL   BUN 7  6 - 23 mg/dL   Creatinine, Ser 0.86  0.50 - 1.10 mg/dL   Calcium 9.4  8.4 - 57.8 mg/dL   Total Protein 7.4  6.0 - 8.3 g/dL   Albumin 3.9  3.5 - 5.2 g/dL   AST 26  0 - 37 U/L   ALT 28  0 - 35 U/L   Alkaline Phosphatase 90  39 - 117 U/L   Total Bilirubin 0.3  0.3 - 1.2 mg/dL   GFR calc non Af Amer >90  >90 mL/min   GFR calc Af Amer >90  >90 mL/min  ETHANOL      Result Value Range   Alcohol, Ethyl (B) <11  0 - 11 mg/dL  URINE RAPID DRUG SCREEN (HOSP PERFORMED)      Result Value Range   Opiates NONE DETECTED  NONE DETECTED   Cocaine NONE DETECTED  NONE DETECTED   Benzodiazepines NONE DETECTED  NONE DETECTED   Amphetamines NONE DETECTED  NONE DETECTED    Tetrahydrocannabinol POSITIVE (*) NONE DETECTED   Barbiturates NONE DETECTED  NONE DETECTED  POCT PREGNANCY, URINE      Result Value Range   Preg Test, Ur NEGATIVE  NEGATIVE    2245:  Pt  has a bed at [facility], will transfer.    Laray Anger, DO 09/18/12 1515
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
## 2012-09-17 ENCOUNTER — Inpatient Hospital Stay (HOSPITAL_COMMUNITY)
Admission: EM | Admit: 2012-09-17 | Discharge: 2012-09-25 | DRG: 897 | Disposition: A | Discharge status: Home / Self Care | Payer: 59 | Attending: Psychiatry | Admitting: Psychiatry

## 2012-09-17 ENCOUNTER — Encounter (HOSPITAL_COMMUNITY): Payer: Self-pay | Admitting: *Deleted

## 2012-09-17 DIAGNOSIS — R45851 Suicidal ideations: Secondary | ICD-10-CM

## 2012-09-17 DIAGNOSIS — Z79899 Other long term (current) drug therapy: Secondary | ICD-10-CM

## 2012-09-17 DIAGNOSIS — F332 Major depressive disorder, recurrent severe without psychotic features: Secondary | ICD-10-CM | POA: Diagnosis present

## 2012-09-17 DIAGNOSIS — F1220 Cannabis dependence, uncomplicated: Secondary | ICD-10-CM

## 2012-09-17 DIAGNOSIS — J45909 Unspecified asthma, uncomplicated: Secondary | ICD-10-CM | POA: Diagnosis present

## 2012-09-17 DIAGNOSIS — F411 Generalized anxiety disorder: Secondary | ICD-10-CM | POA: Diagnosis present

## 2012-09-17 DIAGNOSIS — F1020 Alcohol dependence, uncomplicated: Principal | ICD-10-CM | POA: Diagnosis present

## 2012-09-17 HISTORY — DX: Cannabis dependence, uncomplicated: F12.20

## 2012-09-17 HISTORY — DX: Major depressive disorder, recurrent severe without psychotic features: F33.2

## 2012-09-17 HISTORY — DX: Alcohol dependence, uncomplicated: F10.20

## 2012-09-17 MED ORDER — BUPROPION HCL ER (XL) 150 MG PO TB24
150.0000 mg | ORAL_TABLET | Freq: Every day | ORAL | Status: DC
  Administered 2012-09-18 – 2012-09-25 (×8): 150 mg via ORAL
  Filled 2012-09-17 (×8): qty 1
  Filled 2012-09-17: qty 14
  Filled 2012-09-17 (×2): qty 1

## 2012-09-17 MED ORDER — TRAZODONE HCL 50 MG PO TABS
50.0000 mg | ORAL_TABLET | Freq: Every evening | ORAL | Status: DC | PRN
  Administered 2012-09-17: 50 mg via ORAL
  Filled 2012-09-17 (×3): qty 1

## 2012-09-17 MED ORDER — THIAMINE HCL 100 MG/ML IJ SOLN: 100.0000 mg | Freq: Once | INTRAMUSCULAR | Status: DC

## 2012-09-17 MED ORDER — CHLORDIAZEPOXIDE HCL 25 MG PO CAPS
25.0000 mg | ORAL_CAPSULE | Freq: Once | ORAL | Status: AC
  Administered 2012-09-17: 25 mg via ORAL
  Filled 2012-09-17: qty 1

## 2012-09-17 MED ORDER — VITAMIN B-1 100 MG PO TABS
100.0000 mg | ORAL_TABLET | Freq: Every day | ORAL | Status: DC
  Administered 2012-09-18 – 2012-09-25 (×8): 100 mg via ORAL
  Filled 2012-09-17 (×10): qty 1

## 2012-09-17 MED ORDER — CHLORDIAZEPOXIDE HCL 25 MG PO CAPS
25.0000 mg | ORAL_CAPSULE | Freq: Four times a day (QID) | ORAL | Status: AC | PRN
  Administered 2012-09-19: 25 mg via ORAL
  Filled 2012-09-17: qty 1

## 2012-09-17 MED ORDER — CHLORDIAZEPOXIDE HCL 25 MG PO CAPS
25.0000 mg | ORAL_CAPSULE | Freq: Every day | ORAL | Status: AC
  Administered 2012-09-21: 25 mg via ORAL
  Filled 2012-09-17: qty 1

## 2012-09-17 MED ORDER — NICOTINE 21 MG/24HR TD PT24
21.0000 mg | MEDICATED_PATCH | Freq: Every day | TRANSDERMAL | Status: DC
  Administered 2012-09-17 – 2012-09-20 (×3): 21 mg via TRANSDERMAL
  Filled 2012-09-17 (×9): qty 1

## 2012-09-17 MED ORDER — CITALOPRAM HYDROBROMIDE 10 MG PO TABS
10.0000 mg | ORAL_TABLET | Freq: Every day | ORAL | Status: DC
  Administered 2012-09-17 – 2012-09-21 (×5): 10 mg via ORAL
  Filled 2012-09-17 (×7): qty 1

## 2012-09-17 MED ORDER — HYDROXYZINE HCL 25 MG PO TABS
25.0000 mg | ORAL_TABLET | Freq: Four times a day (QID) | ORAL | Status: AC | PRN
  Administered 2012-09-17 – 2012-09-19 (×4): 25 mg via ORAL
  Filled 2012-09-17: qty 1

## 2012-09-17 MED ORDER — ADULT MULTIVITAMIN W/MINERALS CH
1.0000 | ORAL_TABLET | Freq: Every day | ORAL | Status: DC
  Administered 2012-09-17 – 2012-09-25 (×9): 1 via ORAL
  Filled 2012-09-17 (×11): qty 1

## 2012-09-17 MED ORDER — MAGNESIUM HYDROXIDE 400 MG/5ML PO SUSP: 30.0000 mL | Freq: Every day | ORAL | Status: DC | PRN

## 2012-09-17 MED ORDER — ACETAMINOPHEN 325 MG PO TABS: 650.0000 mg | ORAL_TABLET | Freq: Four times a day (QID) | ORAL | Status: DC | PRN

## 2012-09-17 MED ORDER — CHLORDIAZEPOXIDE HCL 25 MG PO CAPS
25.0000 mg | ORAL_CAPSULE | Freq: Three times a day (TID) | ORAL | Status: AC
  Administered 2012-09-18 – 2012-09-19 (×3): 25 mg via ORAL
  Filled 2012-09-17 (×3): qty 1

## 2012-09-17 MED ORDER — ALUM & MAG HYDROXIDE-SIMETH 200-200-20 MG/5ML PO SUSP: 30.0000 mL | ORAL | Status: DC | PRN

## 2012-09-17 MED ORDER — CHLORDIAZEPOXIDE HCL 25 MG PO CAPS
25.0000 mg | ORAL_CAPSULE | Freq: Four times a day (QID) | ORAL | Status: AC
  Administered 2012-09-17 – 2012-09-18 (×6): 25 mg via ORAL
  Filled 2012-09-17 (×6): qty 1

## 2012-09-17 MED ORDER — TRAZODONE HCL 100 MG PO TABS
100.0000 mg | ORAL_TABLET | Freq: Every evening | ORAL | Status: DC | PRN
  Administered 2012-09-17 – 2012-09-24 (×16): 100 mg via ORAL
  Filled 2012-09-17: qty 28
  Filled 2012-09-17 (×7): qty 1
  Filled 2012-09-17 (×2): qty 28
  Filled 2012-09-17 (×17): qty 1

## 2012-09-17 MED ORDER — LOPERAMIDE HCL 2 MG PO CAPS: 2.0000 mg | ORAL_CAPSULE | ORAL | Status: AC | PRN

## 2012-09-17 MED ORDER — CHLORDIAZEPOXIDE HCL 25 MG PO CAPS
25.0000 mg | ORAL_CAPSULE | ORAL | Status: AC
  Administered 2012-09-19 – 2012-09-20 (×2): 25 mg via ORAL
  Filled 2012-09-17 (×2): qty 1

## 2012-09-17 MED ORDER — ONDANSETRON 4 MG PO TBDP
4.0000 mg | ORAL_TABLET | Freq: Four times a day (QID) | ORAL | Status: AC | PRN
  Administered 2012-09-17: 4 mg via ORAL
  Filled 2012-09-17: qty 1

## 2012-09-17 NOTE — Progress Notes (Signed)
Recreation Therapy Notes  Date: 07.15.2014 Time: 2:30pm Location: [address] Dayroom      Group Topic/Focus: Musician (AAA/T)  Session: Animal Assisted Activities (AAA)  Dog Team: [place] & handler  Affect: Euthymic  Cognitive: Appropraite  Participation Level: Active  Additional Comments: Patient interacted appropriately with peer, dog team, LRT and MHT.   [name] Galvin Aversa, LRT/CTRS  Jearl Klinefelter 09/17/2012 4:54 PM
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
## 2012-09-17 NOTE — BHH Group Notes (Signed)
[facility] LCSW Aftercare Discharge Planning Group Note   09/17/2012 8:45 AM  Participation Quality:  Alert and Appropriate   Mood/Affect:  Appropriate, Flat and Depressed  Depression Rating:  7-8  Anxiety Rating: 5  Thoughts of Suicide:  Pt denies SI/HI  Will you contract for safety?   Yes  Current AVH:  Pt denies  Plan for Discharge/Comments:  Pt attended discharge planning group and actively participated in group.  CSW provided pt with today's workbook.  Pt states that she has been depressed for the past week and can not identify any triggers or stressors that caused this depression.  Pt states that this is her first time seeking help.  Pt states that she lives in [place] with girlfriend and can return home there.  Pt states that she doesn't have any outpatient providers but would be interested in MH IOP with [facility].  CSW contacted IOP to have pt assessed for the program.  No further needs voiced by pt at this time.    Transportation Means: Pt reports access to transportation  Supports: Pt states that her girlfriend is supportive  Evelyn Fields, LCSWA 09/17/2012 10:16 AM

## 2012-09-17 NOTE — Progress Notes (Signed)
Adult Psychoeducational Group Note  Date:  09/17/2012 Time:  12:17 PM  Group Topic/Focus:  Recovery Goals:   The focus of this group is to identify appropriate goals for recovery and establish a plan to achieve them.  Participation Level:  Active  Participation Quality:  Appropriate, Attentive and Supportive  Affect:  Appropriate  Cognitive:  Alert and Appropriate  Insight: Good  Engagement in Group:  Engaged and Supportive  Modes of Intervention:  Activity, Discussion, Socialization and Support  Additional Comments:  Pt came to group and was supportive to the other members of the group. She shared that she wanted to change her alcohol use and using elicit drugs. She plans on changing these things by staying on her medications and using an outpatient program, she wants to change her drug use by exercising and dissociating from suggestive people in her life.The purpose of this group was for the pt to identify two things in their life that is standing between them and recovery. The pt then thought of and shared their specific and measurable goals to address the changes they wanted to see in their life. The goals impacted personal recovery.     Cathlean Cower 09/17/2012, 12:17 PM
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
## 2012-09-17 NOTE — Progress Notes (Signed)
D: Pt has been anxious , sad & depressed.CIWA =4 . Pt is participating in all unit activities & is pleasant & cooperative.A: Pt was medicated with vistaril for anxiety @ 2025. Continues on 15 minute checks.R: Pt safety maintained.

## 2012-09-17 NOTE — Progress Notes (Signed)
Recreation Therapy Notes  Date: 07.15.2014 Time: 3:00pm Location: [address] Dayroom   Group Topic/Focus: Problem Solving, Communication  Participation Level:  Active  Participation Quality:  Appropriate  Affect:  Euthymic  Cognitive:  Appropriate  Additional Comments: Activity: LIfeboat ; Explanation: Patients were given the following scenario: We have all chartered a Radio producer for the afternoon with the following people: Materials engineer, Rolan Bucco, [place], [place], [place], Curator, Psychologist, occupational, Runner, broadcasting/film/video, Pregnant Woman, Female Physician, Nurse, Personnel officer, Ex-Convict, Ex-marine, and [employer]. Halfway through our boat tour and the [employer] a leak and begins to sink. We are able to fit everyone in the room, plus 8 of the people previously listed in the life raft. As a group you must decide who you want to take on the life raft and who you want to leave behind.   Patient actively participated in group activity. Patient debated appropriately with peers to come to a group decision on individuals to put on life raft. Patient voiced her opinion in a non-threatening way and used good justification behind putting certain individuals on the life raft. Patient identified the ability to make good decisions as a skill necessary to complete this activity. Patient verablized understanding that skills used in group session are important to life post d/c.   [name] [name], LRT/CTRS  Kabe Mckoy L 09/17/2012 4:30 PM

## 2012-09-17 NOTE — Progress Notes (Signed)
Patient ID: Evelyn Fields, female   DOB: 29-Apr-1985, 27 y.o.   MRN: [id number]  Pt was pleasant and cooperative, but anxious during the adm process. Stated she went to her PCP on Fri, and was referred to [facility]. Stated that once at [facility] she was told that she would be admitted to [facility]. Stated that she refused and was told by staff that if she promised not to "smoke or drink", she could be discharged but that Mon she needed to make an O/P appt. Pt stated she did drink and smoke this weekend.  Pt stated she attempted to get an appt today, but was told Thurs would be the earliest. Pt stated, "I couldn't wait. I need help".  Pt also stated she's "extremely depressed and doesn't know why". Pt became tearful once on the unit while eating in the dayroom. Pt denied SI, HI, A/V
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
## 2012-09-17 NOTE — [facility] Group Notes (Signed)
[facility] LCSW Group Therapy  09/17/2012 4:19 PM  Type of Therapy:  Group Therapy  Participation Level:  Active  Participation Quality:  Attentive  Affect:  Appropriate  Cognitive:  Alert  Insight:  Engaged  Engagement in Therapy:  Engaged  Modes of Intervention:  Discussion, Education, Exploration, Socialization and Support  Summary of Progress/Problems: MHA Speaker came to talk about his personal journey with substance abuse and addiction. The pt processed ways by which to relate to the speaker. MHA speaker provided handouts and educational information pertaining to groups and services offered by the [facility]. Evelyn Fields was attentive and engaged throughout group today. She participated by asking the speaker questions about self medicating with marijuana to combat symptoms of MI. She talked about her personal experience with alcohol and marijuana and shows insight regarding her habit of self medicating with drugs/alcohol. Evelyn Fields appears to be making progress in group AEB active participation, asking questions, and engaging with other group members.    Smart, Evelyn Fields 09/17/2012, 4:19 PM

## 2012-09-18 DIAGNOSIS — F339 Major depressive disorder, recurrent, unspecified: Secondary | ICD-10-CM

## 2012-09-18 MED ORDER — QUETIAPINE FUMARATE 50 MG PO TABS
50.0000 mg | ORAL_TABLET | Freq: Every day | ORAL | Status: DC
  Administered 2012-09-18 – 2012-09-19 (×2): 50 mg via ORAL
  Filled 2012-09-18 (×4): qty 1

## 2012-09-18 MED ORDER — QUETIAPINE FUMARATE 50 MG PO TABS
50.0000 mg | ORAL_TABLET | Freq: Three times a day (TID) | ORAL | Status: DC
  Administered 2012-09-18 – 2012-09-21 (×8): 50 mg via ORAL
  Filled 2012-09-18 (×12): qty 1

## 2012-09-18 MED ORDER — QUETIAPINE FUMARATE 50 MG PO TABS
50.0000 mg | ORAL_TABLET | Freq: Once | ORAL | Status: AC
  Administered 2012-09-18: 50 mg via ORAL
  Filled 2012-09-18 (×2): qty 1

## 2012-09-18 MED ORDER — QUETIAPINE FUMARATE 50 MG PO TABS
ORAL_TABLET | ORAL | Status: AC
  Administered 2012-09-18: 50 mg
  Filled 2012-09-18: qty 1

## 2012-09-18 NOTE — Tx Team (Signed)
Interdisciplinary Treatment Plan Update (Adult)  Date: 09/18/2012  Time Reviewed:  9:45 AM  Progress in Treatment: Attending groups: Yes Participating in groups:  Yes Taking medication as prescribed:  Yes Tolerating medication:  Yes Family/Significant othe contact made: CSW assessing  Patient understands diagnosis:  Yes Discussing patient identified problems/goals with staff:  Yes Medical problems stabilized or resolved:  Yes Denies suicidal/homicidal ideation: Yes Issues/concerns per patient self-inventory:  Yes Other:  New problem(s) identified: N/A  Discharge Plan or Barriers: CSW assessing for appropriate referrals.    Reason for Continuation of Hospitalization: Anxiety Depression Medication Stabilization Detox  Comments: N/A  Estimated length of stay: 3-5 days  For review of initial/current patient goals, please see plan of care.  Attendees: Patient:     Family:     Physician:  Dr. Lugo 09/18/2012 10:32 AM   Nursing:   Donna Shimp, RN 09/18/2012 10:32 AM   Clinical Social Worker:  Luann Aspinwall Horton, LCSWA 09/18/2012 10:32 AM   Other: Brittany Tyson, RN 09/18/2012 10:32 AM   Other:  Heather Smart, LCSWA 09/18/2012 10:32 AM   Other:     Other:     Other:    Other:    Other:    Other:    Other:    Other:     Scribe for Treatment Team:   Horton, Malky Rudzinski Nicole, 09/18/2012 , 10:32 AM   
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
## 2012-09-18 NOTE — Progress Notes (Signed)
Patient ID: Evelyn Fields, female   DOB: 1986/02/09, 27 y.o.   MRN: [id number] She has been up and to groups today interacting with peers and staff. Spoke of feeling like she could hit the wall this AM, see new order for Seroquel,. This PM she stated that she was less irritable  but was still anxious.  She has been more talking and laughing with peers this PM.
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
## 2012-09-18 NOTE — [facility] Group Notes (Signed)
[facility] LCSW Aftercare Discharge Planning Group Note   09/18/2012 8:45 AM  Participation Quality:  Alert and Appropriate   Mood/Affect:  Appropriate, Flat and Depressed  Depression Rating:  7  Anxiety Rating: 7  Thoughts of Suicide:  Pt denies SI/HI  Will you contract for safety?   Yes  Current AVH:  Pt denies  Plan for Discharge/Comments:  Pt attended discharge planning group and actively participated in group.  CSW provided pt with today's workbook.  Pt states that she is feeling down and agitated today but doesn't know why.   Pt states that she lives in [place] with girlfriend and can return home there.  Pt states that she is interested in MH IOP with [facility].  CSW contacted IOP to have pt assessed for the program.  Waiting for pt to be assessed.  No further needs voiced by pt at this time.    Transportation Means: Pt reports access to transportation  Supports: Pt states that her girlfriend is supportive  Reyes Ivan, LCSWA 09/18/2012 11:35 AM

## 2012-09-18 NOTE — Progress Notes (Signed)
Patient ID: Evelyn Fields, female   DOB: 09/25/1985, 27 y.o.   MRN: [id number]  D: Patient pleasant on approach tonight. Requesting another dose of trazodone due to not being able to sleep. Some anxiety at this time but no withdrawals minimal. A: Staff will monitor on q 15 minute checks, follow treatment plan, and give meds as ordered. R: Went to room and is reading a book.

## 2012-09-18 NOTE — Progress Notes (Signed)
[facility] MD Progress Note  09/18/2012 3:08 PM Evelyn Fields  MRN:  [id number] Subjective:  Jihan endorses a lot of agitation, irritability. Feels she could go off easily the way she is feeling.Afraid to lose control. She does not want to feel this way. States that she she thinks it has to do wit not smoking marijuana. She is still endorsing not feeling safe. Diagnosis:  Alcohol Dependence, Cannabis Dependence, Major Depression recurrent  ADL's:  Intact  Sleep: Poor  Appetite:  Fair  Suicidal Ideation:  Plan:  denies Intent:  denies Means:  denies Homicidal Ideation:  Plan:  denies Intent:  denies Means:  denies AEB (as evidenced by):  Psychiatric Specialty Exam: Review of Systems  Constitutional: Negative.   HENT: Negative.   Eyes: Negative.   Respiratory: Negative.   Cardiovascular: Negative.   Gastrointestinal: Negative.   Genitourinary: Negative.   Musculoskeletal: Negative.   Skin: Negative.   Neurological: Negative.   Endo/Heme/Allergies: Negative.   Psychiatric/Behavioral: Positive for depression and substance abuse. The patient is nervous/anxious and has insomnia.     Blood pressure 133/84, pulse 97, temperature 97.4 F (36.3 C), temperature source Oral, resp. rate 18, height 5\' 2"  (1.575 m), weight 87.091 kg (192 lb), last menstrual period 08/28/2012.Body mass index is 35.11 kg/(m^2).  General Appearance: Fairly Groomed  Patent attorney::  Fair  Speech:  Clear and Coherent and Slow  Volume:  Decreased  Mood:  Anxious, Depressed and Irritable  Affect:  anxious, tense, angry  Thought Process:  Coherent and Goal Directed  Orientation:  Full (Time, Place, and Person)  Thought Content:  worries, concerns, fear of losing control  Suicidal Thoughts:  Yes, no plans  Homicidal Thoughts:  No  Memory:  Immediate;   Fair Recent;   Fair Remote;   Fair  Judgement:  Fair  Insight:  Present  Psychomotor Activity:  Restlessness and agitated  Concentration:  Fair  Recall:  Fair   Akathisia:  No  Handed:  Right  AIMS (if indicated):     Assets:  Desire for Improvement Social Support Vocational/Educational  Sleep:  Number of Hours: 5   Current Medications: Current Facility-Administered Medications  Medication Dose Route Frequency Provider Last Rate Last Dose  . acetaminophen (TYLENOL) tablet 650 mg  650 mg Oral Q6H PRN Nelly Rout, MD      . alum & mag hydroxide-simeth (MAALOX/MYLANTA) 200-200-20 MG/5ML suspension 30 mL  30 mL Oral Q4H PRN Nelly Rout, MD      . buPROPion (WELLBUTRIN XL) 24 hr tablet 150 mg  150 mg Oral Daily Rachael Fee, MD   150 mg at 09/18/12 0809  . chlordiazePOXIDE (LIBRIUM) capsule 25 mg  25 mg Oral Q6H PRN Nelly Rout, MD      . chlordiazePOXIDE (LIBRIUM) capsule 25 mg  25 mg Oral TID Nelly Rout, MD       Followed by  . [START ON 09/19/2012] chlordiazePOXIDE (LIBRIUM) capsule 25 mg  25 mg Oral BH-qamhs Nelly Rout, MD       Followed by  . [START ON 09/21/2012] chlordiazePOXIDE (LIBRIUM) capsule 25 mg  25 mg Oral Daily Nelly Rout, MD      . citalopram (CELEXA) tablet 10 mg  10 mg Oral Daily Rachael Fee, MD   10 mg at 09/18/12 0809  . hydrOXYzine (ATARAX/VISTARIL) tablet 25 mg  25 mg Oral Q6H PRN Nelly Rout, MD   25 mg at 09/17/12 2023  . loperamide (IMODIUM) capsule 2-4 mg  2-4 mg Oral PRN Archana  Lucianne Muss, MD      . magnesium hydroxide (MILK OF MAGNESIA) suspension 30 mL  30 mL Oral Daily PRN Nelly Rout, MD      . multivitamin with minerals tablet 1 tablet  1 tablet Oral Daily Nelly Rout, MD   1 tablet at 09/18/12 0809  . nicotine (NICODERM CQ - dosed in mg/24 hours) patch 21 mg  21 mg Transdermal Daily Rachael Fee, MD   21 mg at 09/18/12 0809  . ondansetron (ZOFRAN-ODT) disintegrating tablet 4 mg  4 mg Oral Q6H PRN Nelly Rout, MD   4 mg at 09/17/12 0826  . QUEtiapine (SEROQUEL) tablet 50 mg  50 mg Oral TID Rachael Fee, MD      . QUEtiapine (SEROQUEL) tablet 50 mg  50 mg Oral QHS Rachael Fee, MD      .  thiamine (B-1) injection 100 mg  100 mg Intramuscular Once Nelly Rout, MD      . thiamine (VITAMIN B-1) tablet 100 mg  100 mg Oral Daily Nelly Rout, MD   100 mg at 09/18/12 0810  . traZODone (DESYREL) tablet 100 mg  100 mg Oral QHS,MR X 1 Rachael Fee, MD   100 mg at 09/17/12 2335    Lab Results:  Results for orders placed during the hospital encounter of 09/16/12 (from the past 48 hour(s))  CBC     Status: None   Collection Time    09/16/12  8:45 PM      Result Value Range   WBC 8.2  4.0 - 10.5 K/uL   RBC 4.63  3.87 - 5.11 MIL/uL   Hemoglobin 12.9  12.0 - 15.0 g/dL   HCT 40.9  81.1 - 91.4 %   MCV 83.6  78.0 - 100.0 fL   MCH 27.9  26.0 - 34.0 pg   MCHC 33.3  30.0 - 36.0 g/dL   RDW 78.2  95.6 - 21.3 %   Platelets 391  150 - 400 K/uL  COMPREHENSIVE METABOLIC PANEL     Status: None   Collection Time    09/16/12  8:45 PM      Result Value Range   Sodium 139  135 - 145 mEq/L   Potassium 4.2  3.5 - 5.1 mEq/L   Chloride 104  96 - 112 mEq/L   CO2 26  19 - 32 mEq/L   Glucose, Bld 99  70 - 99 mg/dL   BUN 7  6 - 23 mg/dL   Creatinine, Ser 0.86  0.50 - 1.10 mg/dL   Calcium 9.4  8.4 - 57.8 mg/dL   Total Protein 7.4  6.0 - 8.3 g/dL   Albumin 3.9  3.5 - 5.2 g/dL   AST 26  0 - 37 U/L   ALT 28  0 - 35 U/L   Alkaline Phosphatase 90  39 - 117 U/L   Total Bilirubin 0.3  0.3 - 1.2 mg/dL   GFR calc non Af Amer >90  >90 mL/min   GFR calc Af Amer >90  >90 mL/min   Comment:            The eGFR has been calculated     using the CKD EPI equation.     This calculation has not been     validated in all clinical     situations.     eGFR's persistently     <90 mL/min signify     possible Chronic Kidney Disease.  ETHANOL  Status: None   Collection Time    09/16/12  8:45 PM      Result Value Range   Alcohol, Ethyl (B) <11  0 - 11 mg/dL   Comment:            LOWEST DETECTABLE LIMIT FOR     SERUM ALCOHOL IS 11 mg/dL     FOR MEDICAL PURPOSES ONLY  URINE RAPID DRUG SCREEN (HOSP  PERFORMED)     Status: Abnormal   Collection Time    09/16/12  9:35 PM      Result Value Range   Opiates NONE DETECTED  NONE DETECTED   Cocaine NONE DETECTED  NONE DETECTED   Benzodiazepines NONE DETECTED  NONE DETECTED   Amphetamines NONE DETECTED  NONE DETECTED   Tetrahydrocannabinol POSITIVE (*) NONE DETECTED   Barbiturates NONE DETECTED  NONE DETECTED   Comment:            DRUG SCREEN FOR MEDICAL PURPOSES     ONLY.  IF CONFIRMATION IS NEEDED     FOR ANY PURPOSE, NOTIFY LAB     WITHIN 5 DAYS.                LOWEST DETECTABLE LIMITS     FOR URINE DRUG SCREEN     Drug Class       Cutoff (ng/mL)     Amphetamine      1000     Barbiturate      200     Benzodiazepine   200     Tricyclics       300     Opiates          300     Cocaine          300     THC              50  POCT PREGNANCY, URINE     Status: None   Collection Time    09/16/12  9:55 PM      Result Value Range   Preg Test, Ur NEGATIVE  NEGATIVE   Comment:            THE SENSITIVITY OF THIS     METHODOLOGY IS >24 mIU/mL    Physical Findings: AIMS: Facial and Oral Movements Muscles of Facial Expression: None, normal Lips and Perioral Area: None, normal Jaw: None, normal Tongue: None, normal,Extremity Movements Upper (arms, wrists, hands, fingers): None, normal Lower (legs, knees, ankles, toes): None, normal, Trunk Movements Neck, shoulders, hips: None, normal, Overall Severity Severity of abnormal movements (highest score from questions above): None, normal Incapacitation due to abnormal movements: None, normal Patient's awareness of abnormal movements (rate only patient's report): No Awareness, Dental Status Current problems with teeth and/or dentures?: No Does patient usually wear dentures?: No  CIWA:  CIWA-Ar Total: 2 COWS:     Treatment Plan Summary: Daily contact with patient to assess and evaluate symptoms and progress in treatment Medication management  Plan: Supportive approach/coping  skills/relapse prevention           Continue detox           Trial with Seroquel 50 mgup to TID  Medical Decision Making Problem Points:  Review of psycho-social stressors (1) Data Points:  Review of medication regiment & side effects (2) Review of new medications or change in dosage (2)  I certify that inpatient services furnished can reasonably be expected to improve the patient's condition.   Dillan Lunden A 09/18/2012,  3:08 PM

## 2012-09-18 NOTE — Progress Notes (Signed)
Patient ID: Evelyn Fields, female   DOB: 1985-07-25, 27 y.o.   MRN: [id number] Pt did not attend group. She was asleep in her bed.

## 2012-09-19 MED ORDER — IBUPROFEN 200 MG PO TABS
400.0000 mg | ORAL_TABLET | Freq: Four times a day (QID) | ORAL | Status: DC | PRN
  Administered 2012-09-19: 400 mg via ORAL
  Filled 2012-09-19: qty 2

## 2012-09-19 MED ORDER — CARBAMAZEPINE ER 100 MG PO TB12
100.0000 mg | ORAL_TABLET | Freq: Two times a day (BID) | ORAL | Status: DC
  Administered 2012-09-19 – 2012-09-21 (×4): 100 mg via ORAL
  Filled 2012-09-19 (×6): qty 1

## 2012-09-19 NOTE — Progress Notes (Signed)
Adult Psychoeducational Group Note  Date:  09/19/2012 Time:  6:43 PM  Group Topic/Focus:  Overcoming Stress:   The focus of this group is to define stress and help patients assess their triggers.  Participation Level:  Active  Participation Quality:  Appropriate, Attentive, Sharing and Supportive  Affect:  Appropriate  Cognitive:  Appropriate  Insight: Appropriate and Good  Engagement in Group:  Engaged  Modes of Intervention:  Confrontation, Discussion, Education, Socialization and Support  Additional Comments:  Katriel attended and shared during group. Patient defined stress in own terms. Patient completed overcoming stress interview with a peer in the group. Patient was asked what stresses patient out the most, patient explained what changes could be made to decrease stress in patient life. Patient was asked to complete form on stress management ideas during personal development time.    Karleen Hampshire Brittini 09/19/2012, 6:43 PM
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
## 2012-09-19 NOTE — Progress Notes (Signed)
Recreation Therapy Notes  Date: 07.17.2014 Time: 3:00pm Location: [address] Dayroom      Group Topic/Focus: Goal Setting   Participation Level: Active  Participation Quality: Appropriate, Supportive, Sharing  Affect: Euthymic  Cognitive: Appropriate  Additional Comments: Activity: Your Goal ; Explanation: Patients were given a worksheet that helped them define a goal, 5 steps that will hep them reach that goal, a reward for reaching the goal and a place to identify a new goal. The worksheet additionally has places for notes and tips to get back on track.   Patient completed worksheet, but chose not to share goal with group. Patient shared that she often makes changes to her life, but does not stick with them. Patient stated this is specific to her hobbies. Patient shared learning new skills makes her feel a sense of gratitude, accomplishment and makes her feel smarter than she did previously. Patient stated she enjoys feeling this way, but it frustrates her that she does not stick with a skill for long. Patient was able to offer advice and suggestions to peers in group.   [name] Geddy [place], LRT/CTRS  Jearl Klinefelter 09/19/2012 4:15 PM

## 2012-09-19 NOTE — Progress Notes (Signed)
Patient did attend the evening karaoke group. Pt was engaged, supportive, and participated by singing a couple of songs.   

## 2012-09-19 NOTE — Progress Notes (Addendum)
D:  Patient's self inventory sheet, patient needs sleep medications, poor appetite, low energy level, poor attention span.  Rated depression and hopelessness #7.  Has felt cravings/agitation in past 24 hours.  SI, contracts for safety.  Has experienced shingles pain in past 24 hours.  Worst pain 6.5.  After discharge, plans to continue on meds and outpatient intensive therapy.  Feels angry and sad.  Does have discharge plans.  No problems taking meds after discharge. A:  Medications administered per MD orders.  Emotional support and encouragement given patient. R:  Denied HI.  SI off/on, contracts for safety.  Denied A/V hallucinations.  Will continue to monitor for safety with 15 minute checks.  Safety maintained.  Patient's blanket and pillow with black pillowcase was put in patient's locker 120.  During report this morning, staff was informed that patient should not have these items in her room.  Patient gave these items to staff to lock up in her locker.  Patient informed that she will receive these items when she is discharged.

## 2012-09-19 NOTE — Progress Notes (Signed)
At around 745 pm pt requested something for irritability  When she was told it was too early for vistaril which is what she requested and was informed she could have librium which was also for irritability pt became angry   She took the pill then requested the pill she can take three times a day  Pt did not know the name of the pill but rn looked it up and it was seroquel  She was informed she had already taken it 3 times and would get some at bedtime  Pt then turned to walk away mumbling under her breath how the nurses were nasty   She was almost to the door leading to the hall when rn looked through the window and told the pt she could get it early  Pt then became angry and yelled out leave me alone   Pt then came to the nurses station requesting a different nurse and another nurse took over her care   Pt also talked to the ac and told him the rn yelled at her   Another staff member talked with pt and encouraged her to go to karoke and calm down  Pt did the same

## 2012-09-19 NOTE — [facility] Group Notes (Signed)
[facility] LCSW Aftercare Discharge Planning Group Note   09/19/2012 8:45 AM  Participation Quality:  Did Not Attend  Evelyn Fields, LCSWA 09/19/2012 10:22 AM

## 2012-09-19 NOTE — Progress Notes (Signed)
[facility] MD Progress Note  09/19/2012 11:55 AM Evelyn Fields  MRN:  [id number] Subjective:  Evelyn Fields endorses that she is still having a lot of mood fluctuations. States that it comes in waves/ Gets very irritated, very angry and feels she is going to go off any minute. She is trying to avoid confrontations and goes back to her room. She states she does not want to be around people as she is afraid of what she could do. She is also feeling very anxious, depressed. States when she gets jn that state of mind she starts feeling she should kill herself. Feels that a lot of these symptoms have to do with coming off the marijuana (withdrawal or that the marijuana was medicating them) Diagnosis:  Alcohol, Marijuana Dependence, Mood Disorder NOS, Major Depression, Anxiety Disorder NOS   ADL's:  Intact  Sleep: Poor  Appetite:  Poor  Suicidal Ideation:  Plan:  denies Intent:  denies Means:  denies Homicidal Ideation:  Plan:  denies Intent:  denies Means:  denies AEB (as evidenced by):  Psychiatric Specialty Exam: Review of Systems  Constitutional: Positive for malaise/fatigue.  HENT: Negative.   Eyes: Negative.   Respiratory: Negative.   Cardiovascular: Positive for palpitations.  Gastrointestinal: Negative.   Genitourinary: Negative.   Musculoskeletal: Negative.   Skin: Negative.   Neurological: Positive for tremors.  Endo/Heme/Allergies: Negative.   Psychiatric/Behavioral: Positive for depression, suicidal ideas and substance abuse. The patient is nervous/anxious and has insomnia.     Blood pressure 115/82, pulse 99, temperature 97.8 F (36.6 C), temperature source Oral, resp. rate 18, height 5\' 2"  (1.575 m), weight 87.091 kg (192 lb), last menstrual period 08/28/2012.Body mass index is 35.11 kg/(m^2).  General Appearance: Disheveled  Eye Contact::  Minimal  Speech:  Clear and Coherent and reserved, guarded, not spontaneous  Volume:  Decreased  Mood:  Angry, Anxious, Depressed and  Irritable  Affect:  Labile, Tearful and irritability, anxiety, depression  Thought Process:  Coherent and Goal Directed  Orientation:  Full (Time, Place, and Person)  Thought Content:  Rumination and worries, concerns  Suicidal Thoughts:  Yes.  without intent/plan  Homicidal Thoughts:  No  Memory:  Immediate;   Fair Recent;   Fair Remote;   Fair  Judgement:  Fair  Insight:  Present  Psychomotor Activity:  Restlessness  Concentration:  Fair  Recall:  Fair  Akathisia:  No  Handed:  Right  AIMS (if indicated):     Assets:  Desire for Improvement  Sleep:  Number of Hours: 5.75   Current Medications: Current Facility-Administered Medications  Medication Dose Route Frequency Provider Last Rate Last Dose  . acetaminophen (TYLENOL) tablet 650 mg  650 mg Oral Q6H PRN Nelly Rout, MD      . alum & mag hydroxide-simeth (MAALOX/MYLANTA) 200-200-20 MG/5ML suspension 30 mL  30 mL Oral Q4H PRN Nelly Rout, MD      . buPROPion (WELLBUTRIN XL) 24 hr tablet 150 mg  150 mg Oral Daily Rachael Fee, MD   150 mg at 09/19/12 0818  . chlordiazePOXIDE (LIBRIUM) capsule 25 mg  25 mg Oral Q6H PRN Nelly Rout, MD      . chlordiazePOXIDE (LIBRIUM) capsule 25 mg  25 mg Oral TID Nelly Rout, MD   25 mg at 09/19/12 1324   Followed by  . chlordiazePOXIDE (LIBRIUM) capsule 25 mg  25 mg Oral BH-qamhs Nelly Rout, MD       Followed by  . [START ON 09/21/2012] chlordiazePOXIDE (LIBRIUM) capsule 25 mg  25 mg Oral Daily Nelly Rout, MD      . citalopram (CELEXA) tablet 10 mg  10 mg Oral Daily Rachael Fee, MD   10 mg at 09/19/12 0820  . hydrOXYzine (ATARAX/VISTARIL) tablet 25 mg  25 mg Oral Q6H PRN Nelly Rout, MD   25 mg at 09/18/12 2253  . loperamide (IMODIUM) capsule 2-4 mg  2-4 mg Oral PRN Nelly Rout, MD      . magnesium hydroxide (MILK OF MAGNESIA) suspension 30 mL  30 mL Oral Daily PRN Nelly Rout, MD      . multivitamin with minerals tablet 1 tablet  1 tablet Oral Daily Nelly Rout, MD   1  tablet at 09/19/12 0820  . nicotine (NICODERM CQ - dosed in mg/24 hours) patch 21 mg  21 mg Transdermal Daily Rachael Fee, MD   21 mg at 09/18/12 0809  . ondansetron (ZOFRAN-ODT) disintegrating tablet 4 mg  4 mg Oral Q6H PRN Nelly Rout, MD   4 mg at 09/17/12 0826  . QUEtiapine (SEROQUEL) tablet 50 mg  50 mg Oral TID Rachael Fee, MD   50 mg at 09/19/12 1610  . QUEtiapine (SEROQUEL) tablet 50 mg  50 mg Oral QHS Rachael Fee, MD   50 mg at 09/18/12 2149  . thiamine (B-1) injection 100 mg  100 mg Intramuscular Once Nelly Rout, MD      . thiamine (VITAMIN B-1) tablet 100 mg  100 mg Oral Daily Nelly Rout, MD   100 mg at 09/19/12 9604  . traZODone (DESYREL) tablet 100 mg  100 mg Oral QHS,MR X 1 Rachael Fee, MD   100 mg at 09/18/12 2253    Lab Results: No results found for this or any previous visit (from the past 48 hour(s)).  Physical Findings: AIMS: Facial and Oral Movements Muscles of Facial Expression: None, normal Lips and Perioral Area: None, normal Jaw: None, normal Tongue: None, normal,Extremity Movements Upper (arms, wrists, hands, fingers): None, normal Lower (legs, knees, ankles, toes): None, normal, Trunk Movements Neck, shoulders, hips: None, normal, Overall Severity Severity of abnormal movements (highest score from questions above): None, normal Incapacitation due to abnormal movements: None, normal Patient's awareness of abnormal movements (rate only patient's report): No Awareness, Dental Status Current problems with teeth and/or dentures?: No Does patient usually wear dentures?: No  CIWA:  CIWA-Ar Total: 1 COWS:     Treatment Plan Summary: Daily contact with patient to assess and evaluate symptoms and progress in treatment Medication management  Plan: Supportive approach/coping skills/relapse prevention           Continue to detox with Librium            Optimize treatment with Seroquel            Add Tegretol  Medical Decision Making Problem Points:   Review of last therapy session (1) and Review of psycho-social stressors (1) Data Points:  Review of medication regiment & side effects (2) Review of new medications or change in dosage (2)  I certify that inpatient services furnished can reasonably be expected to improve the patient's condition.   Marlon Suleiman A 09/19/2012, 11:55 AM

## 2012-09-20 DIAGNOSIS — F329 Major depressive disorder, single episode, unspecified: Secondary | ICD-10-CM

## 2012-09-20 DIAGNOSIS — F1220 Cannabis dependence, uncomplicated: Secondary | ICD-10-CM

## 2012-09-20 DIAGNOSIS — F1020 Alcohol dependence, uncomplicated: Principal | ICD-10-CM

## 2012-09-20 DIAGNOSIS — F39 Unspecified mood [affective] disorder: Secondary | ICD-10-CM

## 2012-09-20 DIAGNOSIS — F411 Generalized anxiety disorder: Secondary | ICD-10-CM

## 2012-09-20 MED ORDER — QUETIAPINE FUMARATE 100 MG PO TABS
100.0000 mg | ORAL_TABLET | Freq: Every day | ORAL | Status: DC
  Administered 2012-09-20 – 2012-09-21 (×2): 100 mg via ORAL
  Filled 2012-09-20 (×4): qty 1

## 2012-09-20 MED ORDER — GABAPENTIN 100 MG PO CAPS
200.0000 mg | ORAL_CAPSULE | Freq: Three times a day (TID) | ORAL | Status: DC
  Administered 2012-09-20 – 2012-09-21 (×2): 200 mg via ORAL
  Filled 2012-09-20 (×7): qty 2
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
## 2012-09-20 NOTE — Tx Team (Signed)
Interdisciplinary Treatment Plan Update (Adult)  Date: 09/20/2012  Time Reviewed:  9:45 AM  Progress in Treatment: Attending groups: Yes Participating in groups:  Yes Taking medication as prescribed:  Yes Tolerating medication:  Yes Family/Significant othe contact made: CSW will make attempts Patient understands diagnosis:  Yes Discussing patient identified problems/goals with staff:  Yes Medical problems stabilized or resolved:  Yes Denies suicidal/homicidal ideation: Yes Issues/concerns per patient self-inventory:  Yes Other:  New problem(s) identified: N/A  Discharge Plan or Barriers: Pt is scheduled to follow up at [facility] for CDIOP.     Reason for Continuation of Hospitalization: Anxiety Depression Medication Stabilization Detox  Comments: N/A  Estimated length of stay: 2-3 days  For review of initial/current patient goals, please see plan of care.  Attendees: Patient:     Family:     Physician:  Dr. Dub Mikes 09/20/2012 9:50 AM   Nursing:   Dellia Cloud, RN 09/20/2012 9:50 AM   Clinical Social Worker:  Reyes Ivan, LCSWA 09/20/2012 9:50 AM   Other: Serena Colonel, NP 09/20/2012 9:50 AM   Other:  Trula Slade, LCSWA 09/20/2012 9:50  AM   Other:     Other:     Other:    Other:    Other:    Other:    Other:    Other:     Scribe for Treatment Team:   Carmina Miller, 09/20/2012 9:50 AM

## 2012-09-20 NOTE — Progress Notes (Addendum)
D: Patient admits to passive SI but no HI or AVH and contracts verbally for safety.    Pt. appears anxious and agitated.  She requested medication to assist with sleep  And reports that her appetite is good.  She reports her depression to be 4/10 and hopelessness is 8/10.  Pt. Is attending groups and participating as well as interacting appropriately within the millieu.  A: Patient given emotional support from RN. Patient encouraged to come to staff with concerns and/or questions. Patient's medication routine continued. Patient's orders and plan of care reviewed.   R: Patient remains appropriate and cooperative. Will continue to monitor patient q15 minutes for safety.

## 2012-09-20 NOTE — [facility] Group Notes (Signed)
[facility] LCSW Group Therapy  09/20/2012  1:15 PM   Type of Therapy:  Group Therapy  Participation Level:  Active  Participation Quality:  Appropriate and Attentive  Affect:  Appropriate, Depressed and Flat  Cognitive:  Alert and Appropriate  Insight:  Developing/Improving and Engaged  Engagement in Therapy:  Developing/Improving and Engaged  Modes of Intervention:  Clarification, Confrontation, Discussion, Education, Exploration, Limit-setting, Orientation, Problem-solving, Rapport Building, Dance movement psychotherapist, Socialization and Support  Summary of Progress/Problems: The topic for today was feelings about relapse.  Pt discussed what relapse prevention is to them and identified triggers that they are on the path to relapse.  Pt processed their feeling towards relapse and was able to relate to peers.  Pt discussed coping skills that can be used for relapse prevention.   Pt shared that relapse is scary for her as this is her first time dealing with her addiction.  Pt was able to process why relapse is scary for her and her frustration with having to deal with addiction and recovery. Pt actively participated and was engaged in group discussion.    Evelyn Fields, LCSWA 09/20/2012 2:40 PM

## 2012-09-20 NOTE — [facility] Group Notes (Signed)
[facility] LCSW Aftercare Discharge Planning Group Note   09/20/2012 8:45 AM  Participation Quality:  Alert and Appropriate   Mood/Affect:  Appropriate, Flat and Depressed  Depression Rating:  4  Anxiety Rating: 8  Thoughts of Suicide:  Pt endorses HI, denies SI  Will you contract for safety?   Yes  Current AVH:  Pt denies  Plan for Discharge/Comments:  Pt attended discharge planning group and actively participated in group.  CSW provided pt with today's workbook.  Pt states that she is feeling angry about something that happened last night and feels homicidal about this.  Pt didn't go into any further details about this but feels the situation was handled and is safe on the unit.  Pt discussed her addiction to marijuana and feeling that IOP will be a good step down for her.  Pt is scheduled with [facility] for CDIOP.  No further needs voiced by pt at this time.    Transportation Means: Pt reports access to transportation  Supports: Pt states that her girlfriend is supportive  Reyes Ivan, LCSWA 09/20/2012 10:55 AM

## 2012-09-20 NOTE — Progress Notes (Signed)
Adult Psychoeducational Group Note  Date:  09/20/2012 Time:  1:20 PM  Group Topic/Focus:  Relapse Prevention Planning:   The focus of this group is to define relapse and discuss the need for planning to combat relapse.  Participation Level:  Active  Participation Quality:  Appropriate, Sharing and Supportive  Affect:  Appropriate  Cognitive:  Appropriate  Insight: Good  Engagement in Group:  Engaged  Modes of Intervention:  Discussion and Exploration  Additional Comments:  Pt participated in therapeutic activity during 10am group. During relapse prevention group, Pt revealed that her trigger is simply smelling a substance. She revealed that all of her friends smoke and it makes it hard for her to avoid it. She was very engaged and supportive during the group.  [place] Shi K 09/20/2012, 1:20 PM

## 2012-09-20 NOTE — Progress Notes (Signed)
Patient ID: Evelyn Fields, female   DOB: Jul 09, 1985, 27 y.o.   MRN: [id number]  D: Patient up for her 2nd dose of trazodone. Reports that she didn't have a good day today. Reports mood up and down and states she will talk to physician about it in the am.  A: Staff will monitor on q 15 minute check, follow treatment plan, and give meds as ordered. R: Took medication and went to room.

## 2012-09-20 NOTE — [facility] Suicide Risk Assessment (Signed)
[facility] INPATIENT:  Family/Significant Other Suicide Prevention Education  Suicide Prevention Education:  Contact Attempts: Evelyn Fields - girlfriend [phone]), (name of family member/significant other) has been identified by the patient as the family member/significant other with whom the patient will be residing, and identified as the person(s) who will aid the patient in the event of a mental health crisis.  With written consent from the patient, two attempts were made to provide suicide prevention education, prior to and/or following the patient's discharge.  We were unsuccessful in providing suicide prevention education.  A suicide education pamphlet was given to the patient to share with family/significant other.  Date and time of first attempt: 09/19/12 @ 12:40 pm Date and time of second attempt: 09/20/12 @ 12:40 pm  Evelyn Fields cell phone is unable to be reached after numerous attempts made by CSW.  Evelyn Fields was here during lunch visitation but failed to meet CSW after visitation on both dates.  CSW provided information to pt to be shared with Evelyn Fields when she returns home.    Evelyn Fields 09/20/2012, 2:33 PM

## 2012-09-20 NOTE — Progress Notes (Signed)
Patient ID: Evelyn Fields, female   DOB: 26-Jun-1985, 26 y.o.   MRN: [id number] [facility] MD Progress Note  09/20/2012 3:44 PM Evelyn Fields  MRN:  [id number]  Subjective:  Evelyn Fields endorses that she is still having a lot of mood fluctuations, anger issues, rage, frustrations, irritation and racing thoughts. She is endorsing cravings, cravings, cravings. She says that alcohol and weed are the two things that can control her when she is feeling like this. She is endorsing a lot of aprehension about getting discharged sometime. She adds, then what do I do when I get like this? Alcohol is my friend in times like this. Weed will take that calm place that I need. She rated her anxiety at #8.  Diagnosis:  Alcohol, Marijuana Dependence, Mood Disorder NOS, Major Depression, Anxiety Disorder NOS   ADL's:  Intact  Sleep: Poor  Appetite:  Poor  Suicidal Ideation:  Plan:  denies Intent:  denies Means:  denies Homicidal Ideation:  Plan:  denies Intent:  denies Means:  denies AEB (as evidenced by):  Psychiatric Specialty Exam: Review of Systems  Constitutional: Positive for malaise/fatigue.  HENT: Negative.   Eyes: Negative.   Respiratory: Negative.   Cardiovascular: Positive for palpitations.  Gastrointestinal: Negative.   Genitourinary: Negative.   Musculoskeletal: Negative.   Skin: Negative.   Neurological: Positive for tremors.  Endo/Heme/Allergies: Negative.   Psychiatric/Behavioral: Positive for depression, suicidal ideas and substance abuse. The patient is nervous/anxious and has insomnia.     Blood pressure 106/74, pulse 90, temperature 98.3 F (36.8 C), temperature source Oral, resp. rate 16, height 5\' 2"  (1.575 m), weight 87.091 kg (192 lb), last menstrual period 08/28/2012.Body mass index is 35.11 kg/(m^2).  General Appearance: Disheveled  Eye Contact::  Minimal  Speech:  Clear and Coherent and reserved, guarded, not spontaneous  Volume:  Decreased  Mood:  Angry, Anxious, Depressed and  Irritable  Affect:  Labile, Tearful and irritability, anxiety, depression  Thought Process:  Coherent and Goal Directed  Orientation:  Full (Time, Place, and Person)  Thought Content:  Rumination and worries, concerns  Suicidal Thoughts:  Yes.  without intent/plan  Homicidal Thoughts:  No  Memory:  Immediate;   Fair Recent;   Fair Remote;   Fair  Judgement:  Fair  Insight:  Present  Psychomotor Activity:  Restlessness  Concentration:  Fair  Recall:  Fair  Akathisia:  No  Handed:  Right  AIMS (if indicated):     Assets:  Desire for Improvement  Sleep:  Number of Hours: 5.25   Current Medications: Current Facility-Administered Medications  Medication Dose Route Frequency Provider Last Rate Last Dose  . acetaminophen (TYLENOL) tablet 650 mg  650 mg Oral Q6H PRN Nelly Rout, MD      . alum & mag hydroxide-simeth (MAALOX/MYLANTA) 200-200-20 MG/5ML suspension 30 mL  30 mL Oral Q4H PRN Nelly Rout, MD      . buPROPion (WELLBUTRIN XL) 24 hr tablet 150 mg  150 mg Oral Daily Rachael Fee, MD   150 mg at 09/20/12 5621  . carbamazepine (TEGRETOL XR) 12 hr tablet 100 mg  100 mg Oral BID Rachael Fee, MD   100 mg at 09/20/12 0813  . [START ON 09/21/2012] chlordiazePOXIDE (LIBRIUM) capsule 25 mg  25 mg Oral Daily Nelly Rout, MD      . citalopram (CELEXA) tablet 10 mg  10 mg Oral Daily Rachael Fee, MD   10 mg at 09/20/12 0813  . gabapentin (NEURONTIN) capsule 200 mg  200  mg Oral TID Sanjuana Kava, NP      . ibuprofen (ADVIL,MOTRIN) tablet 400 mg  400 mg Oral Q6H PRN Sanjuana Kava, NP   400 mg at 09/19/12 1509  . magnesium hydroxide (MILK OF MAGNESIA) suspension 30 mL  30 mL Oral Daily PRN Nelly Rout, MD      . multivitamin with minerals tablet 1 tablet  1 tablet Oral Daily Nelly Rout, MD   1 tablet at 09/20/12 0813  . nicotine (NICODERM CQ - dosed in mg/24 hours) patch 21 mg  21 mg Transdermal Daily Rachael Fee, MD   21 mg at 09/20/12 0801  . QUEtiapine (SEROQUEL) tablet 100 mg   100 mg Oral QHS Sanjuana Kava, NP      . QUEtiapine (SEROQUEL) tablet 50 mg  50 mg Oral TID Rachael Fee, MD   50 mg at 09/20/12 1219  . thiamine (B-1) injection 100 mg  100 mg Intramuscular Once Nelly Rout, MD      . thiamine (VITAMIN B-1) tablet 100 mg  100 mg Oral Daily Nelly Rout, MD   100 mg at 09/20/12 0813  . traZODone (DESYREL) tablet 100 mg  100 mg Oral QHS,MR X 1 Rachael Fee, MD   100 mg at 09/19/12 2316    Lab Results: No results found for this or any previous visit (from the past 48 hour(s)).  Physical Findings: AIMS: Facial and Oral Movements Muscles of Facial Expression: None, normal Lips and Perioral Area: None, normal Jaw: None, normal Tongue: None, normal,Extremity Movements Upper (arms, wrists, hands, fingers): None, normal Lower (legs, knees, ankles, toes): None, normal, Trunk Movements Neck, shoulders, hips: None, normal, Overall Severity Severity of abnormal movements (highest score from questions above): None, normal Incapacitation due to abnormal movements: None, normal Patient's awareness of abnormal movements (rate only patient's report): No Awareness, Dental Status Current problems with teeth and/or dentures?: No Does patient usually wear dentures?: No  CIWA:  CIWA-Ar Total: 3 COWS:  COWS Total Score: 2  Treatment Plan Summary: Daily contact with patient to assess and evaluate symptoms and progress in treatment Medication management  Plan: Supportive approach/coping skills/relapse prevention Continue to detox with Librium Optimize treatment with Seroquel, increased night time dose from 100 mg to 200 mg. Add Neurontin 200 mg tid for anxiety Obtain Tegretol level.  Medical Decision Making Problem Points:  Review of last therapy session (1) and Review of psycho-social stressors (1) Data Points:  Review of medication regiment & side effects (2) Review of new medications or change in dosage (2)  I certify that inpatient services furnished can  reasonably be expected to improve the patient's condition.   Armandina Stammer I, PMHNP-BC, FNP-BC 09/20/2012, 3:44 PM

## 2012-09-21 MED ORDER — NICOTINE POLACRILEX 2 MG MT GUM
2.0000 mg | CHEWING_GUM | OROMUCOSAL | Status: DC | PRN
  Administered 2012-09-21 – 2012-09-24 (×9): 2 mg via ORAL
  Filled 2012-09-21 (×2): qty 1

## 2012-09-21 MED ORDER — CHLORDIAZEPOXIDE HCL 25 MG PO CAPS
25.0000 mg | ORAL_CAPSULE | Freq: Once | ORAL | Status: AC
  Administered 2012-09-21: 25 mg via ORAL

## 2012-09-21 MED ORDER — QUETIAPINE FUMARATE 50 MG PO TABS
75.0000 mg | ORAL_TABLET | Freq: Three times a day (TID) | ORAL | Status: DC
  Administered 2012-09-21 – 2012-09-22 (×3): 75 mg via ORAL
  Filled 2012-09-21 (×6): qty 1

## 2012-09-21 MED ORDER — CARBAMAZEPINE ER 200 MG PO TB12
200.0000 mg | ORAL_TABLET | Freq: Two times a day (BID) | ORAL | Status: DC
  Administered 2012-09-21 – 2012-09-23 (×4): 200 mg via ORAL
  Filled 2012-09-21 (×6): qty 1

## 2012-09-21 MED ORDER — CITALOPRAM HYDROBROMIDE 20 MG PO TABS
20.0000 mg | ORAL_TABLET | Freq: Every day | ORAL | Status: DC
  Administered 2012-09-22 – 2012-09-25 (×4): 20 mg via ORAL
  Filled 2012-09-21: qty 14
  Filled 2012-09-21 (×5): qty 1

## 2012-09-21 NOTE — Progress Notes (Signed)
Patient ID: Evelyn Fields, female   DOB: 02-Mar-1986, 27 y.o.   MRN: [id number] D: Patient lying in bed with eyes closed. Respirations even and non-labored. A: Staff will monitor on q 15 minute checks, follow treatment plan, and give meds as ordered. R: Appears asleep at present.

## 2012-09-21 NOTE — Progress Notes (Signed)
Nursing Progress Note. D. Patient presents with depressed, somewhat irritable mood, affect labile. Patient states '' I didn't get any sleep last night, and I just feel terrible. My anger is a big problem for me and I just feel on edge, and then staff here aren't making it easier sometimes '' She rates her depression at 10/10 on depression scale, 10 being worst depression, 1 being none, however she denies any suicidal ideation at this time. Patient has attended unit programming today.  A. Allowed patient to ventilate, support and encouragement provided. Discussed patients substance abuse, and healthy coping skills for mood. Patient provided with medication information handouts as well as medication education provided by Clinical research associate during med pass. Patient noted to brighten with writer throughout shift, however noted pt easily irritated throughout shift. Patient denies any A/V Hallucinations at this time. No further voiced concerns. R. Pt currently cooperative at this time, will continue to monitor q 15 minutes for safety.

## 2012-09-21 NOTE — Progress Notes (Signed)
Patient did attend the evening speaker AA meeting.  

## 2012-09-21 NOTE — [facility] Group Notes (Signed)
[facility] Group Notes:  (Nursing/MHT/Case Management/Adjunct)  Date:  09/21/2012  Time:  3:51 PM  Type of Therapy:  Psychoeducational Skills  Participation Level:  Active  Participation Quality:  Appropriate  Affect:  Depressed  Cognitive:  Appropriate  Insight:  Appropriate  Engagement in Group:  Engaged  Modes of Intervention:  Problem-solving  Summary of Progress/Problems:Pt did attend Healthy Coping Skills group, she was able to engage in treatment.    Jacquelyne Balint Shanta 09/21/2012, 3:51 PM

## 2012-09-21 NOTE — Progress Notes (Signed)
Patient ID: Evelyn Fields, female   DOB: [birth year], 27 y.o.   MRN: [id number] D.Patient at dining hall became agitated during meal time with peer - and per staff report began yelling and screaming walked out into hallway, very agitated requiring staff redirection  to return to unit. The patient returned to the unit cursing, screaming stating '' I want to fight, that's what I feel like doing now, people don't know what we're going through here ''A. staff redirected the patient, allowed to ventilate and support given. Patient states '' I need something for my nerves!'' Clinical research associate notified provider Elmon Kirschner of above situation and patients behaviors. Patient encouraged to use coping skills and support given to de-escalate.R. Patient currently visiting with family and friends, and no physical aggression noted. Will continue to monitor q 15 minutes for safety.

## 2012-09-21 NOTE — Progress Notes (Signed)
[facility] MD Progress Note  09/21/2012 2:08 PM Evelyn Fields  MRN:  [id number] Subjective:  Continues to have a hard time. Still with mood instability, irritability, anger, fear of losing control. Still experiencing the "waves" of emotion, still endorses withdrawal, sever cravings. States she gets herself frustrated. Aware that she personalizes and reacts to her own perception of what is going on. (Overeacts) When she gets that upset she starts thinking about hurting herself Diagnosis:  Alcohol, Marijuana Dependence, Mood Disorder NOS  ADL's:  Intact  Sleep: Poor  Appetite:  Fair  Suicidal Ideation:  Plan:  Denies Intent:  denies Means:  denies Homicidal Ideation:  Plan:  denies Intent:  denies Means:  denies AEB (as evidenced by):  Psychiatric Specialty Exam: Review of Systems  Constitutional: Negative.   HENT: Negative.   Eyes: Negative.   Respiratory: Negative.   Cardiovascular: Negative.   Gastrointestinal: Negative.   Genitourinary: Negative.   Musculoskeletal: Negative.   Skin: Negative.   Neurological: Negative.   Endo/Heme/Allergies: Negative.   Psychiatric/Behavioral: Positive for depression, suicidal ideas and substance abuse. The patient is nervous/anxious and has insomnia.     Blood pressure 101/68, pulse 78, temperature 97.9 F (36.6 C), temperature source Oral, resp. rate 18, height 5\' 2"  (1.575 m), weight 87.091 kg (192 lb), last menstrual period 08/28/2012.Body mass index is 35.11 kg/(m^2).  General Appearance: Fairly Groomed  Patent attorney::  Fair  Speech:  Clear and Coherent  Volume:  Increased  Mood:  Anxious, Depressed, Irritable and "craving"  Affect:  irritated,anxious, worried  Thought Process:  Coherent and Goal Directed  Orientation:  Full (Time, Place, and Person)  Thought Content:  worries, concerns  Suicidal Thoughts:  Intermitent  Homicidal Thoughts:  No  Memory:  Immediate;   Fair Recent;   Fair Remote;   Fair  Judgement:  Fair  Insight:   Present  Psychomotor Activity:  Restlessness  Concentration:  Fair  Recall:  Fair  Akathisia:  No  Handed:  Right  AIMS (if indicated):     Assets:  Desire for Improvement Housing Social Support Vocational/Educational  Sleep:  Number of Hours: 5.5   Current Medications: Current Facility-Administered Medications  Medication Dose Route Frequency Provider Last Rate Last Dose  . acetaminophen (TYLENOL) tablet 650 mg  650 mg Oral Q6H PRN Nelly Rout, MD      . alum & mag hydroxide-simeth (MAALOX/MYLANTA) 200-200-20 MG/5ML suspension 30 mL  30 mL Oral Q4H PRN Nelly Rout, MD      . buPROPion (WELLBUTRIN XL) 24 hr tablet 150 mg  150 mg Oral Daily Rachael Fee, MD   150 mg at 09/21/12 0829  . carbamazepine (TEGRETOL XR) 12 hr tablet 200 mg  200 mg Oral BID Rachael Fee, MD      . Melene Muller ON 09/22/2012] citalopram (CELEXA) tablet 20 mg  20 mg Oral Daily Rachael Fee, MD      . ibuprofen (ADVIL,MOTRIN) tablet 400 mg  400 mg Oral Q6H PRN Sanjuana Kava, NP   400 mg at 09/19/12 1509  . magnesium hydroxide (MILK OF MAGNESIA) suspension 30 mL  30 mL Oral Daily PRN Nelly Rout, MD      . multivitamin with minerals tablet 1 tablet  1 tablet Oral Daily Nelly Rout, MD   1 tablet at 09/21/12 0829  . nicotine (NICODERM CQ - dosed in mg/24 hours) patch 21 mg  21 mg Transdermal Daily Rachael Fee, MD   21 mg at 09/20/12 0801  . nicotine  polacrilex (NICORETTE) gum 2 mg  2 mg Oral PRN Rachael Fee, MD   2 mg at 09/21/12 1000  . QUEtiapine (SEROQUEL) tablet 100 mg  100 mg Oral QHS Sanjuana Kava, NP   100 mg at 09/20/12 2134  . QUEtiapine (SEROQUEL) tablet 75 mg  75 mg Oral TID Rachael Fee, MD   75 mg at 09/21/12 1133  . thiamine (B-1) injection 100 mg  100 mg Intramuscular Once Nelly Rout, MD      . thiamine (VITAMIN B-1) tablet 100 mg  100 mg Oral Daily Nelly Rout, MD   100 mg at 09/21/12 0830  . traZODone (DESYREL) tablet 100 mg  100 mg Oral QHS,MR X 1 Rachael Fee, MD   100 mg at 09/20/12  2309    Lab Results: No results found for this or any previous visit (from the past 48 hour(s)).  Physical Findings: AIMS: Facial and Oral Movements Muscles of Facial Expression: None, normal Lips and Perioral Area: None, normal Jaw: None, normal Tongue: None, normal,Extremity Movements Upper (arms, wrists, hands, fingers): None, normal Lower (legs, knees, ankles, toes): None, normal, Trunk Movements Neck, shoulders, hips: None, normal, Overall Severity Severity of abnormal movements (highest score from questions above): None, normal Incapacitation due to abnormal movements: None, normal Patient's awareness of abnormal movements (rate only patient's report): No Awareness, Dental Status Current problems with teeth and/or dentures?: No Does patient usually wear dentures?: No  CIWA:  CIWA-Ar Total: 4 COWS:  COWS Total Score: 2  Treatment Plan Summary: Daily contact with patient to assess and evaluate symptoms and progress in treatment Medication management  Plan: Supportive approach/coping skills/relapse prevention           Continue to optimize response to psychotropics           Increase the Tegretol to 200 mg BID                                      Seroquel to 75 mg TID, 100 mg HS          CBT/Mindfulness  Medical Decision Making Problem Points:  Review of last therapy session (1) and Review of psycho-social stressors (1) Data Points:  Review of medication regiment & side effects (2) Review of new medications or change in dosage (2)  I certify that inpatient services furnished can reasonably be expected to improve the patient's condition.   Evelyn Fields A 09/21/2012, 2:08 PM

## 2012-09-21 NOTE — Progress Notes (Signed)
D. Pt has been up and has been active in milieu and has participated in various milieu activities this evening. Pt spoke about having feelings of anger and rage and endorsed having cravings for alcohol and spoke about how she often dealt with her problems was with alcohol. Pt also spoke about feelings of anxiety and also that she has not been sleeping well and has been waking up in cold sweats the past few nights. A. Medication education given, support and encouragement provided. R. Pt verbalized understanding, will continue to monitor.
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
## 2012-09-22 MED ORDER — QUETIAPINE FUMARATE 100 MG PO TABS
100.0000 mg | ORAL_TABLET | Freq: Three times a day (TID) | ORAL | Status: DC
  Administered 2012-09-22 – 2012-09-25 (×10): 100 mg via ORAL
  Filled 2012-09-22 (×4): qty 1
  Filled 2012-09-22: qty 70
  Filled 2012-09-22: qty 1
  Filled 2012-09-22: qty 70
  Filled 2012-09-22 (×2): qty 1
  Filled 2012-09-22: qty 70
  Filled 2012-09-22 (×6): qty 1

## 2012-09-22 MED ORDER — QUETIAPINE FUMARATE 200 MG PO TABS
200.0000 mg | ORAL_TABLET | Freq: Every day | ORAL | Status: DC
  Administered 2012-09-22 – 2012-09-24 (×3): 200 mg via ORAL
  Filled 2012-09-22 (×5): qty 1
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
## 2012-09-22 NOTE — Progress Notes (Signed)
Adult Psychoeducational Group Note  Date:  09/22/2012 Time:  7:19 PM  Group Topic/Focus:  Perspective: Through this group patients will identify negative irrational thoughts about themselves and how looking at things in a different perspective can help to challenge those thoughts. Patients will participate in an activity identifying two images in one picture. They will discuss what the members saw in the pictures and the importance of having different perspectives.  Participation Level:  Active  Participation Quality:  Appropriate and Attentive  Affect:  Appropriate  Cognitive:  Alert and Appropriate  Insight: Appropriate  Engagement in Group:  Engaged  Modes of Intervention:  Education  Additional Comments:  Pt appeared to understand the purpose of this group.   Dalia Heading 09/22/2012, 7:19 PM

## 2012-09-22 NOTE — Progress Notes (Signed)
Nursing Progress Note. D. Patient presents with irritable mood, affect labile. She continues to report feeling ''angry'' and states '' i just have no control over myself sometimes, I still thought about confronting that lady from yesterday'' she rates her mood as depressed, stating she is a 5/10 on depression scale with 10 being worst depression 1 being none. Patient has been visible on the unit, interactive with peers, and attending unit programming. A. Writer discussed coping skills for anger and controlling negative thoughts, and identifying triggers. Patient continues to have poor insight and accept little responsibility for her mood/actions. She did also endorse vague fleeting SI but contracts for safety. R. Patient is currently cooperative and calm on the unit. Will continue to monitor q 15 minutes for safety and continue with treatment plan.

## 2012-09-22 NOTE — Progress Notes (Addendum)
Patient did not attend the evening speaker AA meeting. Pt remained in bed during meeting.   

## 2012-09-22 NOTE — [facility] Group Notes (Signed)
[facility] Group Notes:  (Nursing/MHT/Case Management/Adjunct)  Date:  09/22/2012  Time:  10:06 AM  Type of Therapy:  Psychoeducational Skills  Participation Level:  Active  Participation Quality:  Appropriate, Sharing and Supportive  Affect:  Appropriate  Cognitive:  Alert  Insight:  Appropriate  Engagement in Group:  Supportive  Modes of Intervention:  Discussion and Education  Summary of Progress/Problems: Discussed healthy coping skills and reviewed self inventory with RN.  Malva Limes 09/22/2012, 10:08 AM

## 2012-09-22 NOTE — [facility] Group Notes (Signed)
[facility] Group Notes:  (Nursing/MHT/Case Management/Adjunct)  Date:  09/22/2012  Time:  3:28 PM  Type of Therapy:  Psychoeducational Skills  Participation Level:  Active  Participation Quality:  Appropriate  Affect:  Appropriate  Cognitive:  Appropriate  Insight:  Appropriate  Engagement in Group:  Engaged  Modes of Intervention:  Problem-solving  Summary of Progress/Problems: Pt did attend support system group, and was able to engage in treatment.   Jacquelyne Balint Shanta 09/22/2012, 3:28 PM

## 2012-09-22 NOTE — [facility] Group Notes (Signed)
[facility] Group Notes:  (Clinical Social Work)  09/22/2012  10:00-11:00AM  Summary of Progress/Problems:   The main focus of today's process group was to   identify the patient's current support system and decide on other supports that can be put in place.  Four definitions/levels of support were discussed and an exercise was utilized to show how much stronger we become with additional supports.  An emphasis was placed on using counselor, doctor, therapy groups, 12-step groups, and problem-specific support groups to expand supports, as well as doing something different than has been done before. The patient expressed a willingness to add people from the current inpatient group, but also is aware of the hospital position on this.  She stated she has been set up to go to the SAIOP group at [facility] and is looking forward to it.  She did not understand why she would need to see a doctor, assuming that Dr. Dub Mikes would continue to prescribe for her.  The group helped her to understand, and she will likely see Dr. Mila Homer at [facility]  She was very sedated this morning, but attempted continually to remain present and engaged in the group.  Type of Therapy:  Process Group with Motivational Interviewing  Participation Level:  Active  Participation Quality:  Attentive, Drowsy, Sharing and Supportive  Affect:  Blunted  Cognitive:  Appropriate and Oriented  Insight:  Engaged  Engagement in Therapy:  Engaged  Modes of Intervention:   Education, Support and Processing, Activity  [employer], LCSW 09/22/2012, 12:13 PM

## 2012-09-22 NOTE — Progress Notes (Signed)
D: Patient in her room asleep on first approach.  Patient did not attend group tonight but she woke up after group to get her bedtime medications.  Patient states she had a good day.  Patient states she is feeling better but states she is drowsy during the day.  Patient denies SI/HI and denies AVH.  A: Staff to monitor Q 15 mins for safety.  Encouragement and support offered.  Scheduled medications administered per orders.  Nicotine gum administered prn and trazodone administered prn for sleep. R: Patient remains safe on the unit.  Patient did not attend group tonight.  Patient cooperative and taking administered medications.

## 2012-09-22 NOTE — Progress Notes (Signed)
[facility] MD Progress Note  09/22/2012 1:28 PM Evelyn Fields  MRN:  [id number] Subjective:  There was a confrontation with another patient in the cafeteria. She admits she lost control. States that he becomes angry very easily, very fast and once she gets to that point he can not bring herself down. She stays angry for a long time. She throws, hits, brakes. Admits this temper issues have been going on since she was young. The medications she feels are  Helping but not enough. The only thing that help her she claims was smoking pot. Without the marijuana she is not sure how she is gong to handle her temper. When she feels that out of control she starts feeling like hurting herself Diagnosis:  Alcohol, Cannabis Dependence, Mood Disorder NOS, Impulse Control NOS  ADL's:  Intact  Sleep: Fair  Appetite:  Fair  Suicidal Ideation:  Plan:  denies Intent:  denies Means:  denies Homicidal Ideation:  Plan:  denies Intent:  denies Means:  denies AEB (as evidenced by):  Psychiatric Specialty Exam: Review of Systems  Constitutional: Negative.   HENT: Negative.   Eyes: Negative.   Respiratory: Negative.   Cardiovascular: Negative.   Gastrointestinal: Negative.   Genitourinary: Negative.   Musculoskeletal: Negative.   Skin: Negative.   Neurological: Negative.   Endo/Heme/Allergies: Negative.   Psychiatric/Behavioral: Positive for depression, suicidal ideas and substance abuse. The patient is nervous/anxious and has insomnia.        Irritability, anger    Blood pressure 102/71, pulse 80, temperature 97.9 F (36.6 C), temperature source Oral, resp. rate 16, height 5\' 2"  (1.575 m), weight 87.091 kg (192 lb), last menstrual period 08/28/2012.Body mass index is 35.11 kg/(m^2).  General Appearance: Fairly Groomed  Patent attorney::  Fair  Speech:  Clear and Coherent  Volume:  fluctuates  Mood:  Depressed, Dysphoric and Irritable, Anxious, Worried  Affect:  Labile  Thought Process:  Coherent and Goal  Directed  Orientation:  Full (Time, Place, and Person)  Thought Content:  worries,concerns, fear of losing control  Suicidal Thoughts:  Intermittent  Homicidal Thoughts:  No  Memory:  Immediate;   Fair Recent;   Fair Remote;   Fair  Judgement:  Fair  Insight:  Present  Psychomotor Activity:  Restlessness  Concentration:  Fair  Recall:  Fair  Akathisia:  No  Handed:  Right  AIMS (if indicated):     Assets:  Desire for Improvement Social Support Vocational/Educational  Sleep:  Number of Hours: 5   Current Medications: Current Facility-Administered Medications  Medication Dose Route Frequency Provider Last Rate Last Dose  . acetaminophen (TYLENOL) tablet 650 mg  650 mg Oral Q6H PRN Nelly Rout, MD      . alum & mag hydroxide-simeth (MAALOX/MYLANTA) 200-200-20 MG/5ML suspension 30 mL  30 mL Oral Q4H PRN Nelly Rout, MD      . buPROPion (WELLBUTRIN XL) 24 hr tablet 150 mg  150 mg Oral Daily Rachael Fee, MD   150 mg at 09/22/12 0743  . carbamazepine (TEGRETOL XR) 12 hr tablet 200 mg  200 mg Oral BID Rachael Fee, MD   200 mg at 09/22/12 0744  . citalopram (CELEXA) tablet 20 mg  20 mg Oral Daily Rachael Fee, MD   20 mg at 09/22/12 0744  . ibuprofen (ADVIL,MOTRIN) tablet 400 mg  400 mg Oral Q6H PRN Sanjuana Kava, NP   400 mg at 09/19/12 1509  . magnesium hydroxide (MILK OF MAGNESIA) suspension 30 mL  30  mL Oral Daily PRN Nelly Rout, MD      . multivitamin with minerals tablet 1 tablet  1 tablet Oral Daily Nelly Rout, MD   1 tablet at 09/22/12 0744  . nicotine (NICODERM CQ - dosed in mg/24 hours) patch 21 mg  21 mg Transdermal Daily Rachael Fee, MD   21 mg at 09/20/12 0801  . nicotine polacrilex (NICORETTE) gum 2 mg  2 mg Oral PRN Rachael Fee, MD   2 mg at 09/22/12 1118  . QUEtiapine (SEROQUEL) tablet 100 mg  100 mg Oral TID Rachael Fee, MD   100 mg at 09/22/12 1118  . QUEtiapine (SEROQUEL) tablet 200 mg  200 mg Oral QHS Rachael Fee, MD      . thiamine (B-1)  injection 100 mg  100 mg Intramuscular Once Nelly Rout, MD      . thiamine (VITAMIN B-1) tablet 100 mg  100 mg Oral Daily Nelly Rout, MD   100 mg at 09/22/12 0744  . traZODone (DESYREL) tablet 100 mg  100 mg Oral QHS,MR X 1 Rachael Fee, MD   100 mg at 09/21/12 2307    Lab Results: No results found for this or any previous visit (from the past 48 hour(s)).  Physical Findings: AIMS: Facial and Oral Movements Muscles of Facial Expression: None, normal Lips and Perioral Area: None, normal Jaw: None, normal Tongue: None, normal,Extremity Movements Upper (arms, wrists, hands, fingers): None, normal Lower (legs, knees, ankles, toes): None, normal, Trunk Movements Neck, shoulders, hips: None, normal, Overall Severity Severity of abnormal movements (highest score from questions above): None, normal Incapacitation due to abnormal movements: None, normal Patient's awareness of abnormal movements (rate only patient's report): No Awareness, Dental Status Current problems with teeth and/or dentures?: No Does patient usually wear dentures?: No  CIWA:  CIWA-Ar Total: 2 COWS:  COWS Total Score: 2  Treatment Plan Summary: Daily contact with patient to assess and evaluate symptoms and progress in treatment Medication management  Plan: Supportive approach/coping skills/relapse prevention           CBT/ anger management           Increase the Seroquel to 100 mg TID, 200 mg HS           Hold the Wellbutrin R/ O activating effect  Medical Decision Making Problem Points:  Review of last therapy session (1) and Review of psycho-social stressors (1) Data Points:  Review of medication regiment & side effects (2)  I certify that inpatient services furnished can reasonably be expected to improve the patient's condition.   Jakoby Melendrez A 09/22/2012, 1:28 PM

## 2012-09-23 DIAGNOSIS — F639 Impulse disorder, unspecified: Secondary | ICD-10-CM

## 2012-09-23 MED ORDER — CARBAMAZEPINE ER 200 MG PO TB12
400.0000 mg | ORAL_TABLET | Freq: Every day | ORAL | Status: DC
  Administered 2012-09-23 – 2012-09-24 (×2): 400 mg via ORAL
  Filled 2012-09-23 (×4): qty 2

## 2012-09-23 MED ORDER — CARBAMAZEPINE ER 200 MG PO TB12
200.0000 mg | ORAL_TABLET | Freq: Every day | ORAL | Status: DC
  Administered 2012-09-24 – 2012-09-25 (×2): 200 mg via ORAL
  Filled 2012-09-23 (×3): qty 1
  Filled 2012-09-23: qty 42

## 2012-09-23 NOTE — Progress Notes (Addendum)
Provided support with pt in 300 hallway and med room.   Evelyn Fields was tearful in hallway.  Requested support from chaplain.  Anxiety around job.  Had spoken with employer, who informed her that she only had 70 hours of FMLA.  She had spoken with a previous person at employer who had told her she had 110 hours.  She is now worried that she has used all her hours and is in danger of losing her job.   Chaplain provided support around anxiety and fears, assisted Evelyn Fields in identifying steps she could take to clarify FMLA with employer.  Evelyn Fields concerned about what qualifies one for unemployment.  Evelyn Fields stated she had wanted to isolate herself in her room, but decided to talk with chaplain instead.  Chaplain affirmed her decision to share anxiety and identified that this is a different choice than she would have previously made. Helped pt identify feeling overwhelmed by not knowing outcome, racing thoughts, and methods for quieting mind.      Consulted with CSW, who provided fax number for FMLA.  Chaplain provided this number to [facility] [place].    Evelyn Fields MDiv

## 2012-09-23 NOTE — [facility] Group Notes (Signed)
[facility] LCSW Aftercare Discharge Planning Group Note   09/23/2012 8:45 AM  Participation Quality:  Alert and Appropriate   Mood/Affect:  Appropriate, Irritable  Depression Rating:  8  Anxiety Rating: 8  Thoughts of Suicide:  Pt endorses SI/HI  Will you contract for safety?   Yes  Current AVH:  Pt denies  Plan for Discharge/Comments:  Pt attended discharge planning group and actively participated in group.  CSW provided pt with today's workbook.  Pt reports feeling "awful" today.  Pt states that she is agitated about her job, not sending the FMLA paperwork and messing up her pay.  Pt states that she plans to isolate to get over this agitation today.  Pt is scheduled with [facility] for CDIOP.  Pt states that she is still craving marijuana and scared to d/c  No further needs voiced by pt at this time.    Transportation Means: Pt reports access to transportation  Supports: Pt states that her girlfriend is supportive  Reyes Ivan, LCSWA 09/23/2012 9:50 AM

## 2012-09-23 NOTE — Progress Notes (Signed)
Adult Psychoeducational Group Note  Date:  09/23/2012 Time:  1:13 PM  Group Topic/Focus:  Wellness Toolbox:   The focus of this group is to discuss various aspects of wellness, balancing those aspects and exploring ways to increase the ability to experience wellness.  Patients will create a wellness toolbox for use upon discharge.  Participation Level:  Active  Participation Quality:  Monopolizing  Affect:  Blunted  Cognitive:  Alert  Insight: Lacking  Engagement in Group:  Engaged and Monopolizing  Modes of Intervention:  Discussion, Education and Support  Additional Comments:  Evelyn Fields was active in the group discussion today. She was monopolizing at times and her insight was not very good.   Nichola Sizer 09/23/2012, 1:13 PM

## 2012-09-23 NOTE — [facility] Group Notes (Signed)
[facility] LCSW Group Therapy  09/23/2012  1:15 PM   Type of Therapy:  Group Therapy  Participation Level:  Active  Participation Quality:  Appropriate and Attentive  Affect:  Appropriate, Depressed but Drowsy  Cognitive:  Alert and Appropriate  Insight:  Developing/Improving and Engaged  Engagement in Therapy:  Developing/Improving and Engaged  Modes of Intervention:  Clarification, Confrontation, Discussion, Education, Exploration, Limit-setting, Orientation, Problem-solving, Rapport Building, Dance movement psychotherapist, Socialization and Support  Summary of Progress/Problems: Pt identified obstacles faced currently and processed barriers involved in overcoming these obstacles. Pt identified steps necessary for overcoming these obstacles and explored motivation (internal and external) for facing these difficulties head on. Pt further identified one area of concern in their lives and chose a goal to focus on for today.  Pt shared that her biggest obstacle is never feeling satisfied or happy with where she is at.  Pt states that she wants to feel fulfilled and content with life but over thinks everything and has racing thoughts.  Pt was able to relate to peers with similar feelings.  Pt actively participated and was engaged in group discussion.    Reyes Ivan, LCSWA 09/23/2012 2:21 PM

## 2012-09-23 NOTE — Progress Notes (Signed)
Patient ID: Evelyn Fields, female   DOB: 1985/12/28, 27 y.o.   MRN: [id number] She has been up and to groups interacting some with peers and staff. Continues to say that she is easy to get agitated. Has been able to control her agitation today with out medications.. Self inventory: depression 5, hopelessness 5. SI off and on.

## 2012-09-23 NOTE — Progress Notes (Signed)
Chaplain follow up with pt in am.  Pt had requested follow up d/t possible discharge today.  Pt playing game in dayroom with peers.  Stated that she had an incident in the cafeteria earlier and will likely stay a couple more days.  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  Would like chaplain to follow up this pm.   Expressed concern for peer that she knows has been conversing with chaplain, and asked that chaplain follow up with her.    Belva Crome MDiv

## 2012-09-23 NOTE — Tx Team (Signed)
Interdisciplinary Treatment Plan Update (Adult)  Date: 09/23/2012  Time Reviewed:  9:45 AM  Progress in Treatment: Attending groups: Yes Participating in groups:  Yes Taking medication as prescribed:  Yes Tolerating medication:  Yes Family/Significant othe contact made: Yes Patient understands diagnosis:  Yes Discussing patient identified problems/goals with staff:  Yes Medical problems stabilized or resolved:  Yes Denies suicidal/homicidal ideation: Yes Issues/concerns per patient self-inventory:  Yes Other:  New problem(s) identified: N/A  Discharge Plan or Barriers: Pt is scheduled to follow up at [facility] for CDIOP on Wendesday.     Reason for Continuation of Hospitalization: Anxiety Depression Medication Stabilization Suicidal Ideation  Comments: N/A  Estimated length of stay: 1-2 days  For review of initial/current patient goals, please see plan of care.  Attendees: Patient:     Family:     Physician:  Dr. Dub Mikes 09/23/2012 9:53 AM   Nursing:  Dellia Cloud, RN 09/23/2012 9:53 AM   Clinical Social Worker:  Reyes Ivan, LCSWA 09/23/2012 9:53 AM   Other: Serena Colonel, NP 09/23/2012 9:53 AM   Other:  Trula Slade, LCSWA 09/23/2012 9:53 AM   Other:  Onnie Boer, RN case manager 09/23/2012 9:54 AM   Other:  Roswell Miners, RN 09/23/2012 9:54 AM   Other: Robbie Louis, RN 09/23/2012 9:54 AM   Other: Tomasita Morrow, care coordination 09/23/2012 9:54 AM   Other:    Other:    Other:    Other:     Scribe for Treatment Team:   Carmina Miller, 09/23/2012 , 9:53 AM

## 2012-09-23 NOTE — Progress Notes (Signed)
Patient ID: Evelyn Fields, female   DOB: Sep 13, 1985, 27 y.o.   MRN: [id number] [facility] MD Progress Note  09/23/2012 12:57 PM Evelyn Fields  MRN:  [id number]  Subjective:  Evelyn Fields reports today that she is kind of on edge today. And it has something to do with her job. She says she was short $53.00 dollars from her pay check this pay period and it was because her immediate supervisor messed her time up. She continue to endorse serious cravings, anger issues, anxiety and irritation. She maintains being afraid of going out there after discharge. Afraid that she will relapse because she uses marijuana and alcohol to quench her anger and irritability, and things can get to her easily. She admits fleeting thoughts of suicide, denies any intent and or plans to hurt herself and or others.  Diagnosis:  Alcohol, Cannabis Dependence, Mood Disorder NOS, Impulse Control NOS  ADL's:  Intact  Sleep: Fair  Appetite:  Fair  Suicidal Ideation:  Plan:  denies Intent:  denies Means:  denies Homicidal Ideation:  Plan:  denies Intent:  denies Means:  denies AEB (as evidenced by):  Psychiatric Specialty Exam: Review of Systems  Constitutional: Negative.   HENT: Negative.   Eyes: Negative.   Respiratory: Negative.   Cardiovascular: Negative.   Gastrointestinal: Negative.   Genitourinary: Negative.   Musculoskeletal: Negative.   Skin: Negative.   Neurological: Negative.   Endo/Heme/Allergies: Negative.   Psychiatric/Behavioral: Positive for depression, suicidal ideas and substance abuse. The patient is nervous/anxious and has insomnia.        Irritability, anger    Blood pressure 101/68, pulse 78, temperature 97.9 F (36.6 C), temperature source Oral, resp. rate 16, height 5\' 2"  (1.575 m), weight 87.091 kg (192 lb), last menstrual period 08/28/2012.Body mass index is 35.11 kg/(m^2).  General Appearance: Fairly Groomed  Patent attorney::  Fair  Speech:  Clear and Coherent  Volume:  fluctuates  Mood:   Depressed, Dysphoric and Irritable, Anxious, Worried  Affect:  Labile  Thought Process:  Coherent and Goal Directed  Orientation:  Full (Time, Place, and Person)  Thought Content:  worries,concerns, fear of losing control  Suicidal Thoughts:  Intermittent  Homicidal Thoughts:  No  Memory:  Immediate;   Fair Recent;   Fair Remote;   Fair  Judgement:  Fair  Insight:  Present  Psychomotor Activity:  Restlessness, agitation  Concentration:  Fair  Recall:  Fair  Akathisia:  No  Handed:  Right  AIMS (if indicated):     Assets:  Desire for Improvement Social Support Vocational/Educational  Sleep:  Number of Hours: 5.5   Current Medications: Current Facility-Administered Medications  Medication Dose Route Frequency Provider Last Rate Last Dose  . acetaminophen (TYLENOL) tablet 650 mg  650 mg Oral Q6H PRN Nelly Rout, MD      . alum & mag hydroxide-simeth (MAALOX/MYLANTA) 200-200-20 MG/5ML suspension 30 mL  30 mL Oral Q4H PRN Nelly Rout, MD      . buPROPion (WELLBUTRIN XL) 24 hr tablet 150 mg  150 mg Oral Daily Rachael Fee, MD   150 mg at 09/23/12 0837  . [START ON 09/24/2012] carbamazepine (TEGRETOL XR) 12 hr tablet 200 mg  200 mg Oral Daily Sanjuana Kava, NP      . carbamazepine (TEGRETOL) tablet 400 mg  400 mg Oral Q2200 Sanjuana Kava, NP      . citalopram (CELEXA) tablet 20 mg  20 mg Oral Daily Rachael Fee, MD   20 mg at 09/23/12  4098  . ibuprofen (ADVIL,MOTRIN) tablet 400 mg  400 mg Oral Q6H PRN Sanjuana Kava, NP   400 mg at 09/19/12 1509  . magnesium hydroxide (MILK OF MAGNESIA) suspension 30 mL  30 mL Oral Daily PRN Nelly Rout, MD      . multivitamin with minerals tablet 1 tablet  1 tablet Oral Daily Nelly Rout, MD   1 tablet at 09/23/12 0837  . nicotine polacrilex (NICORETTE) gum 2 mg  2 mg Oral PRN Rachael Fee, MD   2 mg at 09/23/12 1191  . QUEtiapine (SEROQUEL) tablet 100 mg  100 mg Oral TID Rachael Fee, MD   100 mg at 09/23/12 1203  . QUEtiapine (SEROQUEL)  tablet 200 mg  200 mg Oral QHS Rachael Fee, MD   200 mg at 09/22/12 2139  . thiamine (B-1) injection 100 mg  100 mg Intramuscular Once Nelly Rout, MD      . thiamine (VITAMIN B-1) tablet 100 mg  100 mg Oral Daily Nelly Rout, MD   100 mg at 09/23/12 0837  . traZODone (DESYREL) tablet 100 mg  100 mg Oral QHS,MR X 1 Rachael Fee, MD   100 mg at 09/22/12 2306    Lab Results: No results found for this or any previous visit (from the past 48 hour(s)).  Physical Findings: AIMS: Facial and Oral Movements Muscles of Facial Expression: None, normal Lips and Perioral Area: None, normal Jaw: None, normal Tongue: None, normal,Extremity Movements Upper (arms, wrists, hands, fingers): None, normal Lower (legs, knees, ankles, toes): None, normal, Trunk Movements Neck, shoulders, hips: None, normal, Overall Severity Severity of abnormal movements (highest score from questions above): None, normal Incapacitation due to abnormal movements: None, normal Patient's awareness of abnormal movements (rate only patient's report): No Awareness, Dental Status Current problems with teeth and/or dentures?: No Does patient usually wear dentures?: No  CIWA:  CIWA-Ar Total: 2 COWS:  COWS Total Score: 2  Treatment Plan Summary: Daily contact with patient to assess and evaluate symptoms and progress in treatment Medication management  Plan: Supportive approach/coping skills/relapse prevention CBT/ anger management Continue Seroquel at 100 mg TID, 200 mg HS. Increase night time Tegretol dose from mg to 400 Q hs Hold the Wellbutrin R/ O activating effect  Medical Decision Making Problem Points:  Review of last therapy session (1) and Review of psycho-social stressors (1) Data Points:  Review of medication regiment & side effects (2)  I certify that inpatient services furnished can reasonably be expected to improve the patient's condition.   Armandina Stammer I, PMHNP-BC, FNP 09/23/2012, 12:57 PM

## 2012-09-23 NOTE — Progress Notes (Signed)
D: Pt endorses passive SI, but contracts for safety. Pt denies HI/AVH. Pt is agitated about a situation at her job, stating the FMLA They stated she had over 100+ hrs , but after talking to them today they said she had less than 70, so they were saying she was out of FMLA. Pt was agitated and very labile. Pt   A: Pt was offered support and encouragement. Pt was given scheduled medications. Pt was encourage to attend groups. Q 15 minute checks were done for safety.   R: Pt is taking medication. Pt receptive to treatment and safety maintained on unit.

## 2012-09-24 NOTE — Progress Notes (Signed)
D: Patient in her room with a visitor on approach.  Patient states she is ready for discharge tomorrow.  Patient states she is uncertain of where she will be going when she is discharged.  Patient states she has been on the phone today calling trying to find an inpatient facility but states she cannot afford it.  Patient states she needs to learn not to let small things bother her.   Patient denies SI/HI and denies AVH.   A: Staff to monitor Q 15 mins for safety.  Encouragement and support offered.  Scheduled medications administered per orders. R: Patient remains safe on the unit.  Patient attended group tonight.  Patient visible on the unit and interacting with peers.  Patient taking administered medications.

## 2012-09-24 NOTE — Progress Notes (Signed)
Adult Psychoeducational Group Note  Date:  09/24/2012 Time:  11:21 AM  Group Topic/Focus:  theraputic activity  Participation Level:  Active  Participation Quality:  Appropriate  Affect:  Appropriate  Cognitive:  Appropriate  Insight: Appropriate  Engagement in Group:  Engaged  Modes of Intervention:  Activity  Additional Comments:  Myrella was active in group.   Nichola Sizer 09/24/2012, 11:21 AM

## 2012-09-24 NOTE — Progress Notes (Signed)
Writer introduce self to pt. Pt verbalized no complaints at this time. Pt in dayroom eating snack. Pt remains safe at this time.

## 2012-09-24 NOTE — Progress Notes (Signed)
[facility] MD Progress Note  09/24/2012 4:30 PM Evelyn Fields  MRN:  [id number] Subjective:  Evelyn Fields continues to have a hard time. Easily frustrated. She states that her mood is unstable. She is still experiencing the irritability, the anger. She is frustrated now as she was informed that she has max her FMLA days. States that she thinks she is in danger of losing her job. She states she wants to go to a rehab as she has continued to have the cravings and she is not sure if she is going to be able to make it feeling like this. States she gets to a point that she wants to hurt herself.  Diagnosis:  Major Depression recurrent, Mood Disorder NOS, Alcohol, Cannabis Dependence  ADL's:  Intact  Sleep: Fair  Appetite:  Fair  Suicidal Ideation:  Plan:  Denies Intent:  Denies Means:  Denies Homicidal Ideation:  Plan:  Denies Intent:  Denies Means:  Denies AEB (as evidenced by):  Psychiatric Specialty Exam: Review of Systems  Constitutional: Negative.   HENT: Negative.   Eyes: Negative.   Respiratory: Negative.   Cardiovascular: Negative.   Gastrointestinal: Negative.   Genitourinary: Negative.   Musculoskeletal: Negative.   Skin: Negative.   Neurological: Negative.   Endo/Heme/Allergies: Negative.   Psychiatric/Behavioral: Positive for depression and substance abuse. The patient is nervous/anxious.        Mood instability    Blood pressure 113/81, pulse 93, temperature 98.9 F (37.2 C), temperature source Oral, resp. rate 18, height 5\' 2"  (1.575 m), weight 87.091 kg (192 lb), last menstrual period 08/28/2012.Body mass index is 35.11 kg/(m^2).  General Appearance: Fairly Groomed  Patent attorney::  Fair  Speech:  Clear and Coherent  Volume:  fluctuates  Mood:  Anxious, Depressed, Irritable and worried  Affect:  anxious, worried  Thought Process:  Coherent and Goal Directed  Orientation:  Full (Time, Place, and Person)  Thought Content:  worries, concerns, fear of losing control  Suicidal  Thoughts:  Intermittent  Homicidal Thoughts:  No  Memory:  Immediate;   Fair Recent;   Fair Remote;   Fair  Judgement:  Fair  Insight:  Present  Psychomotor Activity:  Restlessness  Concentration:  Fair  Recall:  Fair  Akathisia:  No  Handed:  Right  AIMS (if indicated):     Assets:  Desire for Improvement  Sleep:  Number of Hours: 5.5   Current Medications: Current Facility-Administered Medications  Medication Dose Route Frequency Provider Last Rate Last Dose  . acetaminophen (TYLENOL) tablet 650 mg  650 mg Oral Q6H PRN Nelly Rout, MD      . alum & mag hydroxide-simeth (MAALOX/MYLANTA) 200-200-20 MG/5ML suspension 30 mL  30 mL Oral Q4H PRN Nelly Rout, MD      . buPROPion (WELLBUTRIN XL) 24 hr tablet 150 mg  150 mg Oral Daily Rachael Fee, MD   150 mg at 09/24/12 0747  . carbamazepine (TEGRETOL XR) 12 hr tablet 200 mg  200 mg Oral Daily Sanjuana Kava, NP   200 mg at 09/24/12 0747  . carbamazepine (TEGRETOL XR) 12 hr tablet 400 mg  400 mg Oral Q2200 Sanjuana Kava, NP   400 mg at 09/23/12 2111  . citalopram (CELEXA) tablet 20 mg  20 mg Oral Daily Rachael Fee, MD   20 mg at 09/24/12 0747  . ibuprofen (ADVIL,MOTRIN) tablet 400 mg  400 mg Oral Q6H PRN Sanjuana Kava, NP   400 mg at 09/19/12 1509  . magnesium  hydroxide (MILK OF MAGNESIA) suspension 30 mL  30 mL Oral Daily PRN Nelly Rout, MD      . multivitamin with minerals tablet 1 tablet  1 tablet Oral Daily Nelly Rout, MD   1 tablet at 09/24/12 0747  . nicotine polacrilex (NICORETTE) gum 2 mg  2 mg Oral PRN Rachael Fee, MD   2 mg at 09/24/12 1324  . QUEtiapine (SEROQUEL) tablet 100 mg  100 mg Oral TID Rachael Fee, MD   100 mg at 09/24/12 1211  . QUEtiapine (SEROQUEL) tablet 200 mg  200 mg Oral QHS Rachael Fee, MD   200 mg at 09/23/12 2112  . thiamine (B-1) injection 100 mg  100 mg Intramuscular Once Nelly Rout, MD      . thiamine (VITAMIN B-1) tablet 100 mg  100 mg Oral Daily Nelly Rout, MD   100 mg at 09/24/12  0747  . traZODone (DESYREL) tablet 100 mg  100 mg Oral QHS,MR X 1 Rachael Fee, MD   100 mg at 09/23/12 2314    Lab Results:  Results for orders placed during the hospital encounter of 09/17/12 (from the past 48 hour(s))  CARBAMAZEPINE LEVEL, TOTAL     Status: None   Collection Time    09/24/12  6:30 AM      Result Value Range   Carbamazepine Lvl 6.6  4.0 - 12.0 ug/mL    Physical Findings: AIMS: Facial and Oral Movements Muscles of Facial Expression: None, normal Lips and Perioral Area: None, normal Jaw: None, normal Tongue: None, normal,Extremity Movements Upper (arms, wrists, hands, fingers): None, normal Lower (legs, knees, ankles, toes): None, normal, Trunk Movements Neck, shoulders, hips: None, normal, Overall Severity Severity of abnormal movements (highest score from questions above): None, normal Incapacitation due to abnormal movements: None, normal Patient's awareness of abnormal movements (rate only patient's report): No Awareness, Dental Status Current problems with teeth and/or dentures?: No Does patient usually wear dentures?: No  CIWA:  CIWA-Ar Total: 2 COWS:  COWS Total Score: 2  Treatment Plan Summary: Daily contact with patient to assess and evaluate symptoms and progress in treatment Medication management  Plan: Supportive approach/coping skills/relapse prevention           CBT: stress/ anger management           Problem solving  Medical Decision Making Problem Points:  Review of last therapy session (1) Data Points:  Review of medication regiment & side effects (2)  I certify that inpatient services furnished can reasonably be expected to improve the patient's condition.   Shimeka Bacot A 09/24/2012, 4:30 PM

## 2012-09-24 NOTE — [facility] Group Notes (Signed)
[facility] LCSW Group Therapy  09/24/2012 1:30 PM  Type of Therapy:  Group Therapy  Participation Level:  Did Not Attend  Smart, Brendyn Mclaren 09/24/2012, 1:30 PM  

## 2012-09-24 NOTE — Progress Notes (Signed)
Patient ID: Evelyn Fields, female   DOB: 1985/06/18, 27 y.o.   MRN: [id number] He has been up and to groups interacting with peers and staff. Voiced that she was anxious about finding a place to go.  Has said that she was agitated and anxious today but has been able to control both without medication.

## 2012-09-24 NOTE — Progress Notes (Signed)
The focus of this group is to educate the patient on the purpose and policies of crisis stabilization and provide a format to answer questions about their admission.  The group details unit policies and expectations of patients while admitted.  Patient attended and participated in the group discussion.  She was respectful and supportive of others in the group.

## 2012-09-25 DIAGNOSIS — F332 Major depressive disorder, recurrent severe without psychotic features: Secondary | ICD-10-CM

## 2012-09-25 MED ORDER — TRAZODONE HCL 100 MG PO TABS: 100.0000 mg | ORAL_TABLET | Freq: Every evening | ORAL | Status: DC | PRN

## 2012-09-25 MED ORDER — ALBUTEROL SULFATE HFA 108 (90 BASE) MCG/ACT IN AERS: 2.0000 | INHALATION_SPRAY | RESPIRATORY_TRACT | Status: DC | PRN

## 2012-09-25 MED ORDER — CITALOPRAM HYDROBROMIDE 20 MG PO TABS: 20.0000 mg | ORAL_TABLET | Freq: Every day | ORAL | Status: DC

## 2012-09-25 MED ORDER — BUPROPION HCL ER (XL) 150 MG PO TB24: 150.0000 mg | ORAL_TABLET | Freq: Every day | ORAL | Status: DC

## 2012-09-25 MED ORDER — ALBUTEROL SULFATE HFA 108 (90 BASE) MCG/ACT IN AERS
2.0000 | INHALATION_SPRAY | RESPIRATORY_TRACT | Status: DC | PRN
  Filled 2012-09-25: qty 6.7

## 2012-09-25 MED ORDER — QUETIAPINE FUMARATE 100 MG PO TABS: ORAL_TABLET | ORAL | Status: DC

## 2012-09-25 MED ORDER — CARBAMAZEPINE ER 200 MG PO TB12: ORAL_TABLET | ORAL | Status: DC

## 2012-09-25 NOTE — Progress Notes (Addendum)
[facility] Adult Case Management Discharge Plan :  Will you be returning to the same living situation after discharge: Yes,  returning home At discharge, do you have transportation home?:Yes,  girlfriend will pick pt up Do you have the ability to pay for your medications:Yes,  provided samples and access to meds  Release of information consent forms completed and in the chart;  Patient's signature needed at discharge.  Patient to Follow up at: Follow-up Information   Follow up with [facility] On 09/27/2012. (Appointment scheduled at 10:00 am on this date for intake assessment for medication management and therapy)    Contact information:   [address] [place], [place] [address] Phone: [phone] Fax: [phone]      Call [facility]. (Call daily at 9:15 am for treatment bed availability)    Contact information:   [address] [place], [place] [address] Phone: [phone] Fax: [phone]      Patient denies SI/HI:   Yes,  denies SI/HI    Safety Planning and Suicide Prevention discussed:  Yes,  discussed with pt, unable to reach pt's girlfriend.  See suicide prevention note.   Carmina Miller 09/25/2012, 10:15 AM

## 2012-09-25 NOTE — Discharge Summary (Signed)
Physician Discharge Summary Note  Patient:  Evelyn Fields is an 27 y.o., female MRN:  [id number] DOB:  12-04-1985 Patient phone:  [phone] (home)  Patient address:   [address] [place] [address],   Date of Admission:  09/17/2012 Date of Discharge: 09/24/12  Reason for Admission: Increased depression, suicidal ideations, alcoholism, cannabis abuse  Discharge Diagnoses: Active Problems:   Severe recurrent major depression without psychotic features   Marijuana dependence   Alcohol dependence  Review of Systems  Constitutional: Negative.   HENT: Negative.   Eyes: Negative.   Respiratory: Negative.   Cardiovascular: Negative.   Gastrointestinal: Negative.   Genitourinary: Negative.   Musculoskeletal: Negative.   Skin: Negative.   Neurological: Negative.   Endo/Heme/Allergies: Negative.   Psychiatric/Behavioral: Positive for depression (Stabilized with medication prior to discharge) and substance abuse (Alcoholism,  Cannabis dependence). Negative for suicidal ideas, hallucinations and memory loss. The patient is nervous/anxious (Stabilized with medication prior to discharge) and has insomnia (Stabilized with medication prior to discharge).    Axis Diagnosis:   AXIS I:  Alcohol dependence, Cannabis dependence, Severe recurrent major depression without psychotic features AXIS II:  Deferred AXIS III:   Past Medical History  Diagnosis Date  . Asthma   . Shingles 2012, May 2014    left arm  . Anxiety   . Headache(784.0)   . Migraine   . Anxiety   . Depression    AXIS IV:  Substancedependency AXIS V:  62  Level of Care:  OP  Hospital Course:  Last Monday started feeling extremely down. An overwhelming feeling has been over her. Crying, for no reason, throws up, not herself,. Does not want to do anything, thinking about rather dying than living. Went to the PCP on Friday was sent to the ED. The psychiatrist was wanting to get her admitted. She  refused. Over the weekend things got worst. She realized she has been feeling like this on and off since 2010. All friends left with college, done with softball, only thing she has been good at, grandfather passed away. States that she drinks four to six shots (stiff mix drink, or one bottle of wine or 6 pack). Smokes two grams a day on weekends 3.5 gm. After grandfather passes away smokes every day. She feels she is totally dependent on it. If she drinks enough she can experience rage.  Upon admission in this hospital, and after admission assessment/evaluation, it was determined that patient will need detoxification treatment to stabilize her system of drug intoxication and to combat the withdrawal symptoms of this substance as well. And her discharge plans included a referral to a long term treatment center when a bed becomes available and for the mean time an outpatient treatment facility for a more intense substance abuse treatment. Evelyn Fields was then started on a [facility] treatment protocol for her detoxification treatment. She was also enrolled in group counseling sessions and activities where she was counseled and learned coping skills that should help her after discharge to cope better, manage her substance abuse/anger problems to maintain a much longer sobriety. She also was enrolled and attended AA/NA meetings being offered and held on this unit. She has some previous and or identifiable medical conditions that required treatment and or monitoring. She received medication management on a prn basis for all those health issues as well. She was monitored closely for any potential problems that may arise as a result of and or during detoxification treatment. Patient tolerated her treatment  regimen and detoxification treatment without any significant adverse effects and or reactions reported.  Besides detoxification treatment , Evelyn Fields was also ordered and received Tegretol 200 mg in am, and 400 mg Q  bedtime for mood control, Wellbutrin XL 150 mg daily for depression, Citalopram 20 mg daily for depression, Seroquel 100 mg tid and 200 mg Q bedtime for mood control and Trazodone 100 mg Q bedtime for sleep. Patient attended treatment team meeting this am and met with the team staff. His reason for admission, present symptoms, substance abuse issues, response to treatment and discharge plans discussed. Patient endorsed that she is doing well and stable for discharge to pursue the next phase of her substance abuse treatment. It was agreed upon that patient will resume substance abuse treatment at the [facility] on 09/27/12 at 10:00 am. Her treatment includes; medication management, substance abuse treatment and counseling sessions. She is instructed and cautioned to call [facility] everyday at around 09:15 am for a bed availability. And if a bed becomes available, patient will then move in for residential substance abuse treatment. The address, dates, times and contact information for the [facility] and [facility] provided for patient in writing. She was encouraged to join/attend AA/NA meetings being offered and held within her community for a much added support during this recovery times.  Upon discharge, patient adamantly denies suicidal, homicidal ideations, auditory, visual hallucinations, delusional thinking and or withdrawal symptoms. Patient left [facility] with all personal belongings in no apparent distress. He received 2 weeks worth samples of his discharge medications. Transportation per girlfriend.   Consults:  psychiatry  Significant Diagnostic Studies:  labs: CBC with diff, CMP, UDS, Toxicology tests, U/A  Discharge Vitals:   Blood pressure 114/73, pulse 89, temperature 97.1 F (36.2 C), temperature source Oral, resp. rate 16, height 5\' 2"  (1.575 m), weight 87.091 kg (192 lb), last menstrual period 08/28/2012. Body mass index is 35.11 kg/(m^2). Lab Results:   Results for orders placed during the  hospital encounter of 09/17/12 (from the past 72 hour(s))  CARBAMAZEPINE LEVEL, TOTAL     Status: None   Collection Time    09/24/12  6:30 AM      Result Value Range   Carbamazepine Lvl 6.6  4.0 - 12.0 ug/mL    Physical Findings: AIMS: Facial and Oral Movements Muscles of Facial Expression: None, normal Lips and Perioral Area: None, normal Jaw: None, normal Tongue: None, normal,Extremity Movements Upper (arms, wrists, hands, fingers): None, normal Lower (legs, knees, ankles, toes): None, normal, Trunk Movements Neck, shoulders, hips: None, normal, Overall Severity Severity of abnormal movements (highest score from questions above): None, normal Incapacitation due to abnormal movements: None, normal Patient's awareness of abnormal movements (rate only patient's report): No Awareness, Dental Status Current problems with teeth and/or dentures?: No Does patient usually wear dentures?: No  CIWA:  CIWA-Ar Total: 2 COWS:  COWS Total Score: 2  Psychiatric Specialty Exam: See Psychiatric Specialty Exam and Suicide Risk Assessment completed by Attending Physician prior to discharge.  Discharge destination:  Home  Is patient on multiple antipsychotic therapies at discharge:  No   Has Patient had three or more failed trials of antipsychotic monotherapy by history:  No  Recommended Plan for Multiple Antipsychotic Therapies: NA     Medication List    STOP taking these medications       gabapentin 300 MG capsule  Commonly known as:  NEURONTIN     Melatonin 3 MG Tabs      TAKE these  medications     Indication   albuterol 108 (90 BASE) MCG/ACT inhaler  Commonly known as:  PROVENTIL HFA;VENTOLIN HFA  Inhale 2 puffs into the lungs every 4 (four) hours as needed. For asthma   Indication:  Asthma     buPROPion 150 MG 24 hr tablet  Commonly known as:  WELLBUTRIN XL  Take 1 tablet (150 mg total) by mouth daily. For depression   Indication:  Major Depressive Disorder      carbamazepine 200 MG 12 hr tablet  Commonly known as:  TEGRETOL XR  Take 1 tablet (200 mg) daily and 2 tablets (400 mg) Q bedtime: For mood stabilization   Indication:  Alcohol Withdrawal Syndrome, Mood stabilization     citalopram 20 MG tablet  Commonly known as:  CELEXA  Take 1 tablet (20 mg total) by mouth daily. For depression   Indication:  Depression     QUEtiapine 100 MG tablet  Commonly known as:  SEROQUEL  Take 1 tablet 100 mg three times daily and 2 tablets (200 mg) at Bedtime: For mood control   Indication:  Mood Control     traZODone 100 MG tablet  Commonly known as:  DESYREL  Take 1 tablet (100 mg total) by mouth at bedtime and may repeat dose one time if needed. For sleep   Indication:  Trouble Sleeping       Follow-up Information   Follow up with [facility] On 09/27/2012. (Appointment scheduled at 10:00 am on this date for intake assessment for medication management and therapy)    Contact information:   [address] [place], [place] [address] Phone: [phone] Fax: [phone]      Call [facility]. (Call daily at 9:15 am for treatment bed availability)    Contact information:   [address] [place], [place] [address] Phone: [phone] Fax: [phone]     Follow-up recommendations:  Activity:  As tolerated Diet: As recommended by your primary care doctor. Keep all scheduled follow-up appointments as recommended. Continue to work your relapse prevention plan Comments:  Take all your medications as prescribed by your mental healthcare provider. Report any adverse effects and or reactions from your medicines to your outpatient provider promptly. Patient is instructed and cautioned to not engage in alcohol and or illegal drug use while on prescription medicines. In the event of worsening symptoms, patient is instructed to call the crisis hotline, 911 and or go to the nearest ED for appropriate evaluation and treatment of symptoms. Follow-up with  your primary care provider for your other medical issues, concerns and or health care needs.   Total Discharge Time:  Greater than 30 minutes.  Signed: Sanjuana Kava, PMHNP, FNP-BC 09/25/2012, 12:09 PM Personally examined the patient and agree with assessment and plan Madie Reno A. Dub Mikes, M.D.

## 2012-09-25 NOTE — [facility] Suicide Risk Assessment (Signed)
Suicide Risk Assessment  Discharge Assessment     Demographic Factors:  Adolescent or young adult and Evelyn Fields, lesbian, or bisexual orientation  Mental Status Per Nursing Assessment::   On Admission:  NA  Current Mental Status by Physician: In full contact with reality. Her mood is euthymic, her affect is appropriate. States that she is not having active suicidal ideations. Admits to recurrent thoughts of suicide, but states that she has accepted that the ideas might still come but that she can chose not to act on them. She has worked on strategies of how to deal with them. She has continued to work on ways to manage her anger. Her first strategy is not staying at the place where she is being triggered, but walk to away, get herself together and then come back. She is committed to abstinence. She is going to the [facility] and still try to get into [facility]   Loss Factors: NA  Historical Factors: NA  Risk Reduction Factors:   Sense of responsibility to family, Employed, Living with another person, especially a relative and Positive social support  Continued Clinical Symptoms:  Depression:   Comorbid alcohol abuse/dependence Impulsivity Alcohol/Substance Abuse/Dependencies  Cognitive Features That Contribute To Risk:  Closed-mindedness Polarized thinking Thought constriction (tunnel vision)    Suicide Risk:  Mild:  Suicidal ideation of limited frequency, intensity, duration, and specificity.  There are no identifiable plans, no associated intent, mild dysphoria and related symptoms, good self-control (both objective and subjective assessment), few other risk factors, and identifiable protective factors, including available and accessible social support.  Discharge Diagnoses:   AXIS I:  Alcohol, Cannabis Dependence, major Depression, Mood Disorder NOS, Impulse Control NOS AXIS II:  Deferred AXIS III:   Past Medical History  Diagnosis Date  . Asthma   . Shingles 2012, May 2014   left arm  . Anxiety   . Headache(784.0)   . Migraine   . Anxiety   . Depression    AXIS IV:  other psychosocial or environmental problems AXIS V:  61-70 mild symptoms  Plan Of Care/Follow-up recommendations:  Activity:  as tolerated Diet:  regular Follow up outpatient basis: [facility] Is patient on multiple antipsychotic therapies at discharge:  No   Has Patient had three or more failed trials of antipsychotic monotherapy by history:  No  Recommended Plan for Multiple Antipsychotic Therapies: N/A   Thoms Barthelemy A 09/25/2012, 1:27 PM

## 2012-09-25 NOTE — Progress Notes (Signed)
Recreation Therapy Notes  Date: 07.22.2014 Time: 3:00pm Location: [address] Day[address]      Group Topic/Focus: Problem Solving  Participation Level: Active  Participation Quality: Appropraite  Affect: Euthymic  Cognitive: Appropriate  Additional Comments: Activity: Life Boat ; Explanation: Patients were given the following scenario: We have chartered a Radio producer for the afternoon with the below listed guests, half way through out trip the [employer] a leak and we start to sink. We can put everyone in the group on a life raft and sail back to shore. Including the people in this room we can eight of out guests. The list of guest is as follows: Materials engineer, [address], Janyth Pupa, Mitzi Hansen, Runner, broadcasting/film/video, Chef, Curator, Personnel officer, Ex-Convict, Ex-Marine, Pregnant Woman, Female Physician, Nurse.   Due to patient length of stay she has participated in group activity in previous recreation therapy session. Due to previous participation patient chose to let peers voice opinions regarding who should and should not be put in the life boat. After activity had concluded patient participated in group discussion, at this time patient stated where she would have made changes and voiced her opinion as to why she would have made changes. Patient tolerated repeated disruptions from female peers who were distracting and making inappropriate comments. Patient was abel to make the connection between activity and building a healthy support system. Patient verbalized understanding of affect on healthy support to recovery   [employer], LRT/CTRS  Riva Sesma L 09/25/2012 8:30 AM

## 2012-09-25 NOTE — Tx Team (Signed)
Interdisciplinary Treatment Plan Update (Adult)  Date: 09/25/2012  Time Reviewed:  9:45 AM  Progress in Treatment: Attending groups: Yes Participating in groups:  Yes Taking medication as prescribed:  Yes Tolerating medication:  Yes Family/Significant othe contact made: Attempts made Patient understands diagnosis:  Yes Discussing patient identified problems/goals with staff:  Yes Medical problems stabilized or resolved:  Yes Denies suicidal/homicidal ideation: Yes Issues/concerns per patient self-inventory:  Yes Other:  New problem(s) identified: N/A  Discharge Plan or Barriers: Pt will follow up with [facility] for outpatient medication management and therapy and plans to call [facility] daily for treatment bed availability.    Reason for Continuation of Hospitalization: Stable to d/c  Comments: N/A  Estimated length of stay: D/C today  For review of initial/current patient goals, please see plan of care.  Attendees: Patient:  Evelyn Fields  09/25/2012 10:12 AM   Family:     Physician:  Dr. Dub Mikes 09/25/2012 9:53 AM   Nursing:  Burnetta Sabin, RN 09/25/2012 9:53 AM   Clinical Social Worker:  Reyes Ivan, LCSWA 09/25/2012 9:53 AM   Other: Serena Colonel, NP 09/25/2012 9:53 AM   Other:  Trula Slade, LCSWA 09/25/2012 9:53 AM   Other:  Onnie Boer, RN case manager 09/25/2012 9:54 AM   Other:  Roswell Miners, RN 09/25/2012 9:54 AM   Other: Robbie Louis, RN 09/25/2012 9:54 AM   Other: Tomasita Morrow, care coordination 09/25/2012 9:54 AM   Other:    Other:    Other:    Other:     Scribe for Treatment Team:   Carmina Miller, 09/25/2012 , 9:53 AM

## 2012-09-25 NOTE — Progress Notes (Signed)
Adult Psychoeducational Group Note  Date:  09/25/2012 Time:  2:34 PM  Group Topic/Focus:  Personal Choices and Values:   The focus of this group is to help patients assess and explore the importance of values in their lives, how their values affect their decisions, how they express their values and what opposes their expression.  Participation Level:  None  Participation Quality:  Resistant  Affect:  Resistant  Cognitive:  Alert and Oriented  Insight: None  Engagement in Group:  None and Resistant  Modes of Intervention:  Discussion, Education, Exploration and Role-play  Additional Comments:  Pt attended group but when asked to share the personal values important to her she stated "I pass". Pt was encouraged to participate but pt did not budge.  Reynolds Bowl 09/25/2012, 2:34 PM

## 2012-09-25 NOTE — Progress Notes (Signed)
Patient ID: Evelyn Fields, female   DOB: [birth year], 27 y.o.   MRN: [id number] She has been discharged home and was picked up by her roommate. She voiced understanding of discharge teaching about medications, fall risk,s  Follow up plan. She denies thoughts of harming self and stated that her roommate was very supportive of her plan. All belongings taken home with her.

## 2012-09-25 NOTE — Clinical Social Work Note (Signed)
FMLA paperwork faxed to [facility] on this date [phone]), per pt's request.  Consent in the chart.  See pt's chart for the copy of paperwork sent.    Evelyn Fields, [place] 09/25/2012  3:29 PM

## 2012-09-25 NOTE — [facility] Group Notes (Signed)
[facility] LCSW Aftercare Discharge Planning Group Note   09/25/2012 8:45 AM  Participation Quality:  Alert and Appropriate   Mood/Affect:  Appropriate, Irritable  Depression Rating:  8  Anxiety Rating: 8  Thoughts of Suicide:  Pt endorses SI/HI but reports that she is not having thoughts of acting on it.    Will you contract for safety?   Yes  Current AVH:  Pt denies  Plan for Discharge/Comments:  Pt attended discharge planning group and actively participated in group.  CSW provided pt with today's workbook.  Pt reports feeling stable to d/c today.  Pt states that she will return home in [place].  Pt states that her girlfriend got rid of all alcohol and marijuana in the home.  CSW referred pt to [facility] for further inpatient treatment.  A bed was available but pt states that she couldn't afford the deposit.  Pt states that she will follow up at [facility] for outpatient services and call [facility] daily for bed availability.  Pt has FMLA paperwork to be filled out.  CSW will complete with MD and submit.  No further needs voiced by pt at this time.    Transportation Means: Pt reports access to transportation  Supports: Pt states that her girlfriend is supportive  Reyes Ivan, LCSWA 09/25/2012 10:08 AM

## 2012-09-25 NOTE — Progress Notes (Signed)
Recreation Therapy Notes  Date: 07.22.2014 Time: 2:30pm Location: [address] Day[address]     Group Topic/Focus: Engineer, structural Activities (AAA)  Participation Level: Active  Participation Quality: Appropriate  Affect: Euthymic  Cognitive: Appropriate  Additional Comments: Dog Team: [facility] & handler  Patient interacted appropriately with peer, dog team, LRT and MHT.   [name] Diasha Castleman, LRT/CTRS  Jameila Keeny L 09/25/2012 8:49 AM

## 2012-09-30 NOTE — Progress Notes (Signed)
Patient Discharge Instructions:  After Visit Summary (AVS):   Faxed to:  09/30/12 Discharge Summary Note:   Faxed to:  09/30/12 Psychiatric Admission Assessment Note:   Faxed to:  09/30/12 Suicide Risk Assessment - Discharge Assessment:   Faxed to:  09/30/12 Faxed/Sent to the Next Level Care provider:  09/30/12 Faxed to [facility] @ [phone] Faxed to [facility] @ [phone]  Jerelene Redden, 09/30/2012, 2:50 PM

## 2012-12-18 ENCOUNTER — Emergency Department (INDEPENDENT_AMBULATORY_CARE_PROVIDER_SITE_OTHER)
Admission: EM | Admit: 2012-12-18 | Discharge: 2012-12-18 | Disposition: A | Discharge status: Home / Self Care | Payer: 59 | Source: Home / Self Care | Attending: Family Medicine | Admitting: Family Medicine

## 2012-12-18 ENCOUNTER — Other Ambulatory Visit (HOSPITAL_COMMUNITY)
Admission: RE | Admit: 2012-12-18 | Discharge: 2012-12-18 | Disposition: A | Discharge status: Home / Self Care | Payer: 59 | Source: Ambulatory Visit | Attending: Family Medicine | Admitting: Family Medicine

## 2012-12-18 ENCOUNTER — Encounter (HOSPITAL_COMMUNITY): Payer: Self-pay | Admitting: Emergency Medicine

## 2012-12-18 DIAGNOSIS — B9689 Other specified bacterial agents as the cause of diseases classified elsewhere: Secondary | ICD-10-CM

## 2012-12-18 DIAGNOSIS — N760 Acute vaginitis: Secondary | ICD-10-CM

## 2012-12-18 DIAGNOSIS — Z113 Encounter for screening for infections with a predominantly sexual mode of transmission: Secondary | ICD-10-CM | POA: Insufficient documentation

## 2012-12-18 DIAGNOSIS — A499 Bacterial infection, unspecified: Secondary | ICD-10-CM

## 2012-12-18 LAB — POCT URINALYSIS DIP (DEVICE)
Hgb urine dipstick: NEGATIVE
Protein, ur: NEGATIVE mg/dL
Specific Gravity, Urine: 1.025 (ref 1.005–1.030)
Urobilinogen, UA: 0.2 mg/dL (ref 0.0–1.0)

## 2012-12-18 MED ORDER — METRONIDAZOLE 0.75 % VA GEL: 1.0000 | VAGINAL | Status: DC

## 2012-12-18 NOTE — ED Provider Notes (Signed)
CSN: [id number]     Arrival date & time 12/18/12  1629 History   First MD Initiated Contact with Patient 12/18/12 1716     Chief Complaint  Patient presents with  . Vaginal Discharge   (Consider location/radiation/quality/duration/timing/severity/associated sxs/prior Treatment) Patient is a 27 y.o. female presenting with vaginal discharge. The history is provided by the patient.  Vaginal Discharge Quality:  Malodorous, white and watery Severity:  Mild Onset quality:  Gradual Duration:  2 months Timing:  Intermittent Progression:  Unchanged Chronicity:  Recurrent Context comment:  Onset after menses   Past Medical History  Diagnosis Date  . Asthma   . Shingles 2012, May 2014    left arm  . Anxiety   . Headache(784.0)   . Migraine   . Anxiety   . Depression    Past Surgical History  Procedure Laterality Date  . 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 Other Neg Hx    History  Substance Use Topics  . Smoking status: Current Every Day Smoker -- 1.00 packs/day for 3 years    Types: Cigarettes  . Smokeless tobacco: Never Used  . Alcohol Use: 10.8 oz/week    5 Glasses of wine, 6 Cans of beer, 7 Shots of liquor per week     Comment: 4 drinks daily, more on weekends,stopped briefly 07/2012   OB History   Grav Para Term Preterm Abortions TAB SAB Ect Mult Living   0              Review of Systems  Constitutional: Negative.   Gastrointestinal: Negative.   Genitourinary: Positive for vaginal discharge. Negative for vaginal bleeding and vaginal pain.    Allergies  Imitrex and Penicillins  Home Medications   Current Outpatient Rx  Name  Route  Sig  Dispense  Refill  . buPROPion (WELLBUTRIN SR) 150 MG 12 hr tablet   Oral   Take 150 mg by mouth 2 (two) times daily.         Marland Kitchen buPROPion (WELLBUTRIN XL) 150 MG 24 hr tablet   Oral   Take 1 tablet (150 mg total) by mouth daily. For depression   30 tablet   0   . clonazePAM (KLONOPIN) 1 MG tablet   Oral   Take 1 mg by mouth 2 times daily at 12 noon and 4 pm.         . gabapentin (NEURONTIN) 400 MG capsule   Oral   Take 1,200 mg by mouth at bedtime.         Marland Kitchen lurasidone (LATUDA) 80 MG TABS tablet   Oral   Take by mouth daily with breakfast.         . zolpidem (AMBIEN) 10 MG tablet   Oral   Take 10 mg by mouth at bedtime as needed for sleep.         Marland Kitchen albuterol (PROVENTIL HFA;VENTOLIN HFA) 108 (90 BASE) MCG/ACT inhaler   Inhalation   Inhale 2 puffs into the lungs every 4 (four) hours as needed. For asthma         . carbamazepine (TEGRETOL XR) 200 MG 12 hr tablet      Take 1 tablet (200 mg) daily and 2 tablets (400 mg) Q bedtime: For mood stabilization   90 tablet   0   . citalopram (CELEXA) 20 MG tablet   Oral   Take 1 tablet (  20 mg total) by mouth daily. For depression   30 tablet   0   . metroNIDAZOLE (METROGEL VAGINAL) 0.75 % vaginal gel   Vaginal   Place 1 Applicatorful vaginally 1 day or 1 dose. At bedtime for 5 nights   70 g   1   . QUEtiapine (SEROQUEL) 100 MG tablet      Take 1 tablet 100 mg three times daily and 2 tablets (200 mg) at Bedtime: For mood control   150 tablet   0   . traZODone (DESYREL) 100 MG tablet   Oral   Take 1 tablet (100 mg total) by mouth at bedtime and may repeat dose one time if needed. For sleep   60 tablet   0    BP 119/80  Pulse 100  Temp(Src) 98.2 F (36.8 C) (Oral)  Resp 18  SpO2 97%  LMP 12/07/2012 Physical Exam  Nursing note and vitals reviewed. Constitutional: She is oriented to person, place, and time. She appears well-developed and well-nourished.  Abdominal: Soft. Bowel sounds are normal. There is no tenderness.  Genitourinary: Uterus normal. Cervix exhibits no motion tenderness, no discharge and no friability. Right adnexum displays no tenderness. Left adnexum displays no tenderness. No erythema, tenderness or bleeding around the vagina. Vaginal  discharge found.  Neurological: She is alert and oriented to person, place, and time.  Skin: Skin is warm and dry.    ED Course  Procedures (including critical care time) Labs Review Labs Reviewed  POCT URINALYSIS DIP (DEVICE) - Abnormal; Notable for the following:    Leukocytes, UA TRACE (*)    All other components within normal limits  POCT PREGNANCY, URINE  CERVICOVAGINAL ANCILLARY ONLY   Imaging Review No results found.    MDM      Linna Hoff, MD 12/18/12 [phone]

## 2012-12-18 NOTE — ED Notes (Signed)
C/o vaginal d/c odor

## 2012-12-20 NOTE — ED Notes (Signed)
GC/Chlamydia neg., Affirm: Candida and Trich neg., Gardnerella pos.  Pt. adequately treated with Metrogel. Vassie Moselle 12/20/2012

## 2013-01-09 ENCOUNTER — Other Ambulatory Visit: Payer: Self-pay

## 2014-01-22 ENCOUNTER — Emergency Department (HOSPITAL_COMMUNITY)
Admission: EM | Admit: 2014-01-22 | Discharge: 2014-01-22 | Disposition: A | Discharge status: Home / Self Care | Payer: 59 | Attending: Emergency Medicine | Admitting: Emergency Medicine

## 2014-01-22 ENCOUNTER — Encounter (HOSPITAL_COMMUNITY): Payer: Self-pay | Admitting: Emergency Medicine

## 2014-01-22 DIAGNOSIS — J45909 Unspecified asthma, uncomplicated: Secondary | ICD-10-CM | POA: Diagnosis not present

## 2014-01-22 DIAGNOSIS — Z79899 Other long term (current) drug therapy: Secondary | ICD-10-CM | POA: Insufficient documentation

## 2014-01-22 DIAGNOSIS — F329 Major depressive disorder, single episode, unspecified: Secondary | ICD-10-CM | POA: Insufficient documentation

## 2014-01-22 DIAGNOSIS — F419 Anxiety disorder, unspecified: Secondary | ICD-10-CM | POA: Diagnosis not present

## 2014-01-22 DIAGNOSIS — Z880 Allergy status to penicillin: Secondary | ICD-10-CM | POA: Insufficient documentation

## 2014-01-22 DIAGNOSIS — Z720 Tobacco use: Secondary | ICD-10-CM | POA: Diagnosis not present

## 2014-01-22 DIAGNOSIS — G43909 Migraine, unspecified, not intractable, without status migrainosus: Secondary | ICD-10-CM | POA: Diagnosis not present

## 2014-01-22 DIAGNOSIS — Z8619 Personal history of other infectious and parasitic diseases: Secondary | ICD-10-CM | POA: Diagnosis not present

## 2014-01-22 DIAGNOSIS — F32A Depression, unspecified: Secondary | ICD-10-CM

## 2014-01-22 MED ORDER — ONDANSETRON 4 MG PO TBDP: ORAL_TABLET | ORAL | Status: DC

## 2014-01-22 MED ORDER — QUETIAPINE FUMARATE 300 MG PO TABS: 300.0000 mg | ORAL_TABLET | Freq: Every day | ORAL | Status: DC

## 2014-01-22 MED ORDER — QUETIAPINE FUMARATE 25 MG PO TABS
300.0000 mg | ORAL_TABLET | Freq: Once | ORAL | Status: AC
  Administered 2014-01-22: 300 mg via ORAL
  Filled 2014-01-22: qty 4
  Filled 2014-01-22: qty 1

## 2014-01-22 MED ORDER — ONDANSETRON 4 MG PO TBDP
4.0000 mg | ORAL_TABLET | Freq: Once | ORAL | Status: AC
  Administered 2014-01-22: 4 mg via ORAL
  Filled 2014-01-22: qty 1

## 2014-01-22 NOTE — ED Provider Notes (Signed)
CSN: [id number]     Arrival date & time 01/22/14  1600 History   First MD Initiated Contact with Patient 01/22/14 1933     Chief Complaint  Patient presents with  . Depression     (Consider location/radiation/quality/duration/timing/severity/associated sxs/prior Treatment) HPI Evelyn Fields is a 28 year old female with past medical history of depression who presents to the ER with a worsening of her depressive symptoms since September. Patient states in September one of her close friends died, and she has noticed a gradual worsening of her depressive mood. Patient states she's been compliant with Seroquel, however 2 weeks ago lost her prescription for Seroquel. She states her insurance reported that they are unable to cover her Seroquel until she follows up with a psychiatrist. Patient states she noticed an acute worsening of her depressive mood since she has been out of her Seroquel as well. Patient denies having any suicidal or homicidal ideation.   Past Medical History  Diagnosis Date  . Asthma   . Shingles 2012, May 2014    left arm  . Anxiety   . Headache(784.0)   . Migraine   . Anxiety   . Depression    Past Surgical History  Procedure Laterality Date  . 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 Other Neg Hx    History  Substance Use Topics  . Smoking status: Current Every Day Smoker -- 1.00 packs/day for 3 years    Types: Cigarettes  . Smokeless tobacco: Never Used  . Alcohol Use: 10.8 oz/week    5 Glasses of wine, 6 Cans of beer, 7 Shots of liquor per week     Comment: 4 drinks daily, more on weekends,stopped briefly 07/2012   OB History    Gravida Para Term Preterm AB TAB SAB Ectopic Multiple Living   0              Review of Systems  Constitutional: Negative for fever.  HENT: Negative for trouble swallowing.   Eyes: Negative for visual disturbance.  Respiratory: Negative for shortness of breath.    Cardiovascular: Negative for chest pain.  Gastrointestinal: Negative for nausea, vomiting and abdominal pain.  Genitourinary: Negative for dysuria.  Musculoskeletal: Negative for neck pain.  Skin: Negative for rash.  Neurological: Negative for dizziness, weakness and numbness.  Psychiatric/Behavioral:       Depression      Allergies  Imitrex and Penicillins  Home Medications   Prior to Admission medications   Medication Sig Start Date End Date Taking? Authorizing Provider  albuterol (PROVENTIL HFA;VENTOLIN HFA) 108 (90 BASE) MCG/ACT inhaler Inhale 2 puffs into the lungs every 4 (four) hours as needed. For asthma 09/25/12  Yes Sanjuana KavaAgnes I Nwoko, NP  valACYclovir (VALTREX) 500 MG tablet Take 500 mg by mouth as needed (skin outbreak).    Yes Historical Provider, MD  buPROPion (WELLBUTRIN XL) 150 MG 24 hr tablet Take 1 tablet (150 mg total) by mouth daily. For depression Patient not taking: Reported on 01/22/2014 09/25/12   Sanjuana KavaAgnes I Nwoko, NP  carbamazepine (TEGRETOL XR) 200 MG 12 hr tablet Take 1 tablet (200 mg) daily and 2 tablets (400 mg) Q bedtime: For mood stabilization Patient not taking: Reported on 01/22/2014 09/25/12   Sanjuana KavaAgnes I Nwoko, NP  citalopram (CELEXA) 20 MG tablet Take 1 tablet (20 mg total) by mouth daily. For depression Patient not taking: Reported on 01/22/2014 09/25/12  Sanjuana KavaAgnes I Nwoko, NP  metroNIDAZOLE (METROGEL VAGINAL) 0.75 % vaginal gel Place 1 Applicatorful vaginally 1 day or 1 dose. At bedtime for 5 nights Patient not taking: Reported on 01/22/2014 12/18/12   Linna HoffJames D Kindl, MD  ondansetron (ZOFRAN ODT) 4 MG disintegrating tablet 4mg  ODT q4 hours prn nausea/vomit 01/22/14   Monte FantasiaJoseph W Myiah Petkus, PA-C  QUEtiapine (SEROQUEL) 100 MG tablet Take 1 tablet 100 mg three times daily and 2 tablets (200 mg) at Bedtime: For mood control Patient not taking: Reported on 01/22/2014 09/25/12   Sanjuana KavaAgnes I Nwoko, NP  QUEtiapine (SEROQUEL) 300 MG tablet Take 1 tablet (300 mg total) by mouth at  bedtime. 01/22/14   Monte FantasiaJoseph W Jayln Madeira, PA-C  traZODone (DESYREL) 100 MG tablet Take 1 tablet (100 mg total) by mouth at bedtime and may repeat dose one time if needed. For sleep Patient not taking: Reported on 01/22/2014 09/25/12   Sanjuana KavaAgnes I Nwoko, NP   BP 129/76 mmHg  Pulse 84  Temp(Src) 98.2 F (36.8 C) (Oral)  Resp 18  SpO2 99%  LMP 01/10/2014 (Approximate) Physical Exam  Constitutional: She is oriented to person, place, and time. She appears well-developed and well-nourished. No distress.  HENT:  Head: Normocephalic and atraumatic.  Mouth/Throat: Oropharynx is clear and moist. No oropharyngeal exudate.  Eyes: Right eye exhibits no discharge. Left eye exhibits no discharge. No scleral icterus.  Neck: Normal range of motion.  Cardiovascular: Normal rate, regular rhythm and normal heart sounds.   No murmur heard. Pulmonary/Chest: Effort normal and breath sounds normal. No respiratory distress.  Abdominal: Soft. There is no tenderness.  Musculoskeletal: Normal range of motion. She exhibits no edema or tenderness.  Neurological: She is alert and oriented to person, place, and time. No cranial nerve deficit. Coordination normal.  Skin: Skin is warm and dry. No rash noted. She is not diaphoretic.  Psychiatric:  Patient's mood is depressed with affect tearful.  Speech is normal, communicative and appropriate. Behavior is appropriate. Patient does not appear to be responding to any internal stimuli. Thought content is appropriate and goal directed. Judgment and insight are appropriate.    ED Course  Procedures (including critical care time) Labs Review Labs Reviewed - No data to display  Imaging Review No results found.   EKG Interpretation None      MDM   Final diagnoses:  Depression    I believe the patient does need follow-up with psychiatry regarding her depression. Patient continues to deny any suicidal or homicidal ideation. Patient states to me that she does not believe  she is feeling bad enough tonight require inpatient therapy, and believes she can manage her depression as an outpatient safely until she can follow-up with psychiatry. I will provide patient with outpatient resources, and a discount drug card to help her fill her Seroquel until she is able to follow-up. I'll provide patient with 2 weeks of her prescribed Seroquel as a refill until she is able to either go [facility] or make an appointment with one of the outpatient resources. I discussed strict return precautions with patient, and strongly encouraged her to call 911 or return to the ER should she develop any suicidal ideation, or worsening of her symptoms throughout the night. I also encouraged her to stay away from alcohol, or drugs, and patient was agreeable to this plan, voiced understanding of precautions, agreed to return with any worsening of symptoms, and agreed that she would follow-up in the morning regardless of having the refill of the prescription.  I encouraged patient to call or return to ER should she have any questions or concerns.  BP 129/76 mmHg  Pulse 84  Temp(Src) 98.2 F (36.8 C) (Oral)  Resp 18  SpO2 99%  LMP 01/10/2014 (Approximate)  Signed,  Ladona Mow, PA-C 1:30 AM  Patient discussed with Dr. Devoria Albe, M.D.  Monte Fantasia, PA-C 01/23/14 0131  Ward Givens, MD 01/27/14 [phone]

## 2014-01-22 NOTE — ED Notes (Signed)
Pt with hx of depression and reports increased depression over past 2 weeks. Pt has been out of Seroquel for 2 weeks and hasn't been taking depression medication "for a while". Denies SI, HI. Pt is tearful in triage. PCP told her she needed to see psychiatrist before he refilled her prescription for Seroquel. Pt also reports vomiting every morning since she has been off medication

## 2014-01-22 NOTE — Discharge Instructions (Signed)
Follow-up with [facility] tomorrow. Return to the ER if you develop any severe depression, thoughts of suicide or homicide.   Depression Depression refers to feeling sad, low, down in the dumps, blue, gloomy, or empty. In general, there are two kinds of depression: 1. Normal sadness or normal grief. This kind of depression is one that we all feel from time to time after upsetting life experiences, such as the loss of a job or the ending of a relationship. This kind of depression is considered normal, is short lived, and resolves within a few days to 2 weeks. Depression experienced after the loss of a loved one (bereavement) often lasts longer than 2 weeks but normally gets better with time. 2. Clinical depression. This kind of depression lasts longer than normal sadness or normal grief or interferes with your ability to function at home, at work, and in school. It also interferes with your personal relationships. It affects almost every aspect of your life. Clinical depression is an illness. Symptoms of depression can also be caused by conditions other than those mentioned above, such as:  Physical illness. Some physical illnesses, including underactive thyroid gland (hypothyroidism), severe anemia, specific types of cancer, diabetes, uncontrolled seizures, heart and lung problems, strokes, and chronic pain are commonly associated with symptoms of depression.  Side effects of some prescription medicine. In some people, certain types of medicine can cause symptoms of depression.  Substance abuse. Abuse of alcohol and illicit drugs can cause symptoms of depression. SYMPTOMS Symptoms of normal sadness and normal grief include the following:  Feeling sad or crying for short periods of time.  Not caring about anything (apathy).  Difficulty sleeping or sleeping too much.  No longer able to enjoy the things you used to enjoy.  Desire to be by oneself all the time (social isolation).  Lack of energy  or motivation.  Difficulty concentrating or remembering.  Change in appetite or weight.  Restlessness or agitation. Symptoms of clinical depression include the same symptoms of normal sadness or normal grief and also the following symptoms:  Feeling sad or crying all the time.  Feelings of guilt or worthlessness.  Feelings of hopelessness or helplessness.  Thoughts of suicide or the desire to harm yourself (suicidal ideation).  Loss of touch with reality (psychotic symptoms). Seeing or hearing things that are not real (hallucinations) or having false beliefs about your life or the people around you (delusions and paranoia). DIAGNOSIS  The diagnosis of clinical depression is usually based on how bad the symptoms are and how long they have lasted. Your health care provider will also ask you questions about your medical history and substance use to find out if physical illness, use of prescription medicine, or substance abuse is causing your depression. Your health care provider may also order blood tests. TREATMENT  Often, normal sadness and normal grief do not require treatment. However, sometimes antidepressant medicine is given for bereavement to ease the depressive symptoms until they resolve. The treatment for clinical depression depends on how bad the symptoms are but often includes antidepressant medicine, counseling with a mental health professional, or both. Your health care provider will help to determine what treatment is best for you. Depression caused by physical illness usually goes away with appropriate medical treatment of the illness. If prescription medicine is causing depression, talk with your health care provider about stopping the medicine, decreasing the dose, or changing to another medicine. Depression caused by the abuse of alcohol or illicit drugs goes away when  you stop using these substances. Some adults need professional help in order to stop drinking or using  drugs. SEEK IMMEDIATE MEDICAL CARE IF:  You have thoughts about hurting yourself or others.  You lose touch with reality (have psychotic symptoms).  You are taking medicine for depression and have a serious side effect. FOR MORE INFORMATION  National Alliance on Mental Illness: www.nami.[employer] of Mental Health: http://www.maynard.net/ Document Released: 02/18/2000 Document Revised: 07/07/2013 Document Reviewed: 05/22/2011 [facility] Patient Information 2015 [place], [place]. This information is not intended to replace advice given to you by your health care provider. Make sure you discuss any questions you have with your health care provider.   Emergency Department Resource Guide 1) Find a Doctor and Pay Out of Pocket Although you won't have to find out who is covered by your insurance plan, it is a good idea to ask around and get recommendations. You will then need to call the office and see if the doctor you have chosen will accept you as a new patient and what types of options they offer for patients who are self-pay. Some doctors offer discounts or will set up payment plans for their patients who do not have insurance, but you will need to ask so you aren't surprised when you get to your appointment.  2) Contact Your Local Health Department Not all health departments have doctors that can see patients for sick visits, but many do, so it is worth a call to see if yours does. If you don't know where your local health department is, you can check in your phone book. The CDC also has a tool to help you locate your state's health department, and many state websites also have listings of all of their local health departments.  3) Find a Walk-in Clinic If your illness is not likely to be very severe or complicated, you may want to try a walk in clinic. These are popping up all over the country in pharmacies, drugstores, and shopping centers. They're usually staffed by nurse  practitioners or physician assistants that have been trained to treat common illnesses and complaints. They're usually fairly quick and inexpensive. However, if you have serious medical issues or chronic medical problems, these are probably not your best option.  No Primary Care Doctor: - Call Health Connect at  [phone] - they can help you locate a primary care doctor that  accepts your insurance, provides certain services, etc. - Physician Referral Service- [phone]  Chronic Pain Problems: Organization         Address  Phone   Notes  [facility]  [phone] Patients need to be referred by their primary care doctor.   Medication Assistance: Organization         Address  Phone   Notes  [facility] Medication [facility] [address] [place], [place] [address] [phone] --Must be a resident of [facility] -- Must have NO insurance coverage whatsoever (no Medicaid/ Medicare, etc.) -- The pt. MUST have a primary care doctor that directs their care regularly and follows them in the community   MedAssist  [phone]   [employer]  [phone]    Agencies that provide inexpensive medical care: Organization         Address  Phone   Notes  [facility]  [phone]   [facility]    [phone]   [facility] [phone]  [address] [place], [place] [address] [phone]   [facility] [place] [address], [place] [phone]) [phone]   Planned Parenthood    [phone]   [facility]    [phone]   [facility] and [facility]  [address], [place] Phone:  [phone], Fax:  [phone] Hours of Operation:  9 am - 6 pm, M-F.  Also accepts Medicaid/Medicare and self-pay.  [facility]  [address], [place] Phone: [phone], Fax: [phone]) [phone]. Hours of Operation:  8:30 am -  5:30 pm, M-F.  Also accepts Medicaid and self-pay.  [facility] [place] [address], [place] Phone: [phone]   [facility] [address], [place], [place] [phone], Ext. [phone] Mondays & Thursdays: 7-9 AM.  First 15 patients are seen on a first come, first serve basis.    Medicaid-accepting [facility] Providers:  Organization         Address  Phone   Notes  [facility] [address], [place] [phone] Also accepts self-pay patients.  [facility] [address], [place]  [phone]) [phone]   [facility] [address], [place] [phone]   [facility] [address] [place] [address], [place] [phone]) [phone]   Renaye RakersVeita Bland [address], [place]   [phone] Only accepts [place] Access [place]icaid patients after they have their name applied to their card.   Self-Pay (no insurance) in [facility]:  Organization         Address  Phone   Notes  Sickle Cell Patients, [facility] [address], [place] [phone]   [facility] [address], [place] [phone]   [facility]  [address], [place] [phone]) [phone]   [facility]/Dr. Osei-Bonsu  [address] [place] [address], [place] or [id number], [place] [phone] Phone number for both [place] and [place] locations is the same.  [facility] [address], [place] [phone]) [phone]   [facility] [address] [place] [address], [place] or [address] [phone]   [facility] [address], [place] [phone], phone; [phone], fax Sees patients 1st and 3rd Saturday of every month.  Must not qualify for public or private insurance (i.e. Medicaid, Medicare, [place] Health Choice,  Veterans' Benefits)  Household income should be no more than 200% of the poverty level The clinic cannot treat you if you are pregnant or think you are pregnant  Sexually transmitted diseases are not treated at the clinic.    Dental Care: Organization         Address  Phone  Notes  [facility] of [facility] [address], [place] [phone] Accepts children up to age 27 who are enrolled in [place]icaid or [place] Health Choice; pregnant women with a Medicaid card; and children who have applied for Medicaid or [place] Health Choice, but were declined, whose parents can pay a reduced fee at time of service.  [facility] [place]  [address], [place] [phone] Accepts children up to age 27 who are enrolled in [place]icaid or  [place] Health Choice; pregnant women with a Medicaid card; and children who have applied for Medicaid or [place] Health Choice, but were declined, whose parents can pay a reduced fee at time of service.  [facility]  [address], [place] [phone] Patients are seen by appointment only. Walk-ins are not accepted. [facility] will see patients 27 years of age and older. Monday - Tuesday (8am-5pm) Most Wednesdays (8:30-5pm) $30 per visit, cash only  [facility]  [address], [facility] [phone] Patients are seen by appointment only. Walk-ins are not accepted. [facility] will see patients 27 years of age and older. One Wednesday Evening (Monthly: Volunteer Based).  $30 per visit, cash only  [employer] [facility] [facility]  [phone] for adults; Children under age 514, call Graduate Pediatric Dentistry at [phone]) [phone]. Children aged 234-14, please call [phone] to request a pediatric application.  Dental services are provided in all areas of dental care including fillings, crowns and bridges, complete and  partial dentures, implants, gum treatment, root canals, and extractions. Preventive care is also provided. Treatment is provided to both adults and children. Patients are selected via a lottery and there is often a waiting list.   [facility] [address], [place]  [phone] [email or web address]   [facility] [address], [place], [place] [phone], Ext. [phone] Second and Fourth Thursday of each month, opens at 6:30 AM; Clinic ends at 9 AM.  Patients are seen on a first-come first-served basis, and a limited number are seen during each clinic.   [facility]  [address], [place], [place] [phone]   Eligibility Requirements You must have lived in [place], [place], or [place] counties for at least the last three months.   You cannot be eligible for state or federal sponsored National Cityhealthcare insurance, including CIGNAVeterans Administration, [place]icaid, or [employer]Medicare.   You generally cannot be eligible for healthcare insurance through your employer.    How to apply: Eligibility screenings are held every Tuesday and Wednesday afternoon from 1:00 pm until 4:00 pm. You do not need an appointment for the interview!  [facility] [address], [place], [place] [phone]   [facility]  [phone]   [facility]  [phone]   [facility]  [phone]    Behavioral Health Resources in the Community: Intensive Outpatient Programs Organization         Address  Phone  Notes  [facility] [address], [place], [place] [phone]   [facility] [address], [place], [place] [phone]   [facility] [address], [place] [place], [place]  [phone]   [facility] [address],  [place], [place] [phone] or [phone]   Substance Abuse Resources Organization          Address  Phone  Notes  [facility] Drug Services  [phone]   [facility]  [phone]   [facility]  [phone]   [facility]  [phone]   Residential & Outpatient Substance Abuse Program  [phone]   Psychological Services Organization         Address  Phone  Notes  [facility]  [phone]- [phone]   [facility]  [phone]- [phone]   [facility] [address], [place] [phone] or [phone]  Mobile Crisis Teams Organization         Address  Phone  Notes  [facility], Mobile Crisis Care Unit  [phone]   [facility]  [address] [place], [place]   [place] Levels [address], [place] [phone]    Self-Help/Support Groups Organization         Address  Phone             Notes  [facility] [place] - variety of support groups  Decker Call for more information  Narcotics Anonymous (NA), [facility] [address], [employer] [place]  2 meetings at this location   Special educational needs teacher         Address  Phone  Notes  ASAP Residential Treatment [place],    [place]  [phone]   [facility]  [address] [place] [address]6825, [place], [place]   [place], [place] [phone] Admissions: 8am-3pm M-F  Incentives Substance [place] [address]    [place], [place] [phone]   [facility] [address] [place], [place], [place]   [facility] [address]  [place], [place]   [facility] Programs - Intensive Outpatient [address], [place], [place]   [facility] ([place].) [place].,  [place], [place] [phone] or [phone]   [facility] (RTS) [address], [place], [place] Accepts Medicaid  [facility] [place] [address],  [place] [phone] Substance Abuse/Addiction Treatment   [facility] Organization         Address  Phone  Notes  [facility]  [phone]   Domenic Schwab, PhD [address] [place], [place]   [phone] or [phone]   [place] [address] [place], [place] [phone]   [facility] [address], [place], [place] [phone] Insurance/Medicaid/sponsorship through [facility] and Families [address]                                    [place], [place] [phone] [place] [address], [place] [phone]    Dr. Adele Schilder  [phone]   [facility]. 1) [address], [place] 2) [place] 3)  [address], [place] [phone]  [phone]   [place] [phone] or [phone] (After Hours)

## 2014-01-22 NOTE — ED Notes (Signed)
pts vital signs updated and pt awaiting discharge paperwork

## 2014-06-22 ENCOUNTER — Encounter (HOSPITAL_BASED_OUTPATIENT_CLINIC_OR_DEPARTMENT_OTHER): Payer: Self-pay

## 2014-06-22 ENCOUNTER — Emergency Department (HOSPITAL_BASED_OUTPATIENT_CLINIC_OR_DEPARTMENT_OTHER)
Admission: EM | Admit: 2014-06-22 | Discharge: 2014-06-22 | Disposition: A | Discharge status: Home / Self Care | Payer: 59 | Attending: Emergency Medicine | Admitting: Emergency Medicine

## 2014-06-22 DIAGNOSIS — Z880 Allergy status to penicillin: Secondary | ICD-10-CM | POA: Insufficient documentation

## 2014-06-22 DIAGNOSIS — Z8679 Personal history of other diseases of the circulatory system: Secondary | ICD-10-CM | POA: Diagnosis not present

## 2014-06-22 DIAGNOSIS — Z720 Tobacco use: Secondary | ICD-10-CM | POA: Diagnosis not present

## 2014-06-22 DIAGNOSIS — B029 Zoster without complications: Secondary | ICD-10-CM | POA: Insufficient documentation

## 2014-06-22 DIAGNOSIS — R21 Rash and other nonspecific skin eruption: Secondary | ICD-10-CM | POA: Diagnosis present

## 2014-06-22 DIAGNOSIS — Z79899 Other long term (current) drug therapy: Secondary | ICD-10-CM | POA: Diagnosis not present

## 2014-06-22 DIAGNOSIS — F329 Major depressive disorder, single episode, unspecified: Secondary | ICD-10-CM | POA: Diagnosis not present

## 2014-06-22 DIAGNOSIS — J45909 Unspecified asthma, uncomplicated: Secondary | ICD-10-CM | POA: Insufficient documentation

## 2014-06-22 MED ORDER — OXYCODONE-ACETAMINOPHEN 5-325 MG PO TABS: 2.0000 | ORAL_TABLET | ORAL | Status: DC | PRN

## 2014-06-22 MED ORDER — FAMCICLOVIR 500 MG PO TABS: 500.0000 mg | ORAL_TABLET | Freq: Three times a day (TID) | ORAL | Status: DC

## 2014-06-22 MED ORDER — QUETIAPINE FUMARATE 100 MG PO TABS: 300.0000 mg | ORAL_TABLET | Freq: Every day | ORAL | Status: DC

## 2014-06-22 NOTE — ED Provider Notes (Signed)
CSN: [id number]     Arrival date & time 06/22/14  1431 History  This chart was scribed for Nelva Nay, MD by Gwenyth Ober, ED Scribe. This patient was seen in room MH04/MH04 and the patient's care was started at 3:57 PM.    Chief Complaint  Patient presents with  . Rash    The history is provided by the patient. No language interpreter was used.    HPI Comments: Evelyn Fields is a 29 y.o. female with a history of shingles, who presents to the Emergency Department complaining of a constant, gradually worsening, painful rash to her left leg that started yesterday. Pt has a history of shingles and states current symptoms are consistent with prior episodes. She notes increased stress in the last few weeks. Pt is also requesting a refill of Seroquel.   Past Medical History  Diagnosis Date  . Asthma   . Shingles 2012, May 2014    left arm  . Anxiety   . Headache(784.0)   . Migraine   . Anxiety   . Depression    Past Surgical History  Procedure Laterality Date  . 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 Other Neg Hx    History  Substance Use Topics  . Smoking status: Current Every Day Smoker -- 1.00 packs/day for 3 years    Types: Cigarettes  . Smokeless tobacco: Never Used  . Alcohol Use: Yes   OB History    Gravida Para Term Preterm AB TAB SAB Ectopic Multiple Living   0              Review of Systems  Musculoskeletal: Positive for myalgias.  Skin: Positive for rash.  All other systems reviewed and are negative.  Allergies  Imitrex and Penicillins  Home Medications   Prior to Admission medications   Medication Sig Start Date End Date Taking? Authorizing Provider  albuterol (PROVENTIL HFA;VENTOLIN HFA) 108 (90 BASE) MCG/ACT inhaler Inhale 2 puffs into the lungs every 4 (four) hours as needed. For asthma 09/25/12   Sanjuana Kava, NP  carbamazepine (TEGRETOL XR) 200 MG 12 hr tablet Take 1 tablet (200 mg)  daily and 2 tablets (400 mg) Q bedtime: For mood stabilization Patient not taking: Reported on 01/22/2014 09/25/12   Sanjuana Kava, NP  famciclovir (FAMVIR) 500 MG tablet Take 1 tablet (500 mg total) by mouth 3 (three) times daily. 06/22/14   Nelva Nay, MD  oxyCODONE-acetaminophen (PERCOCET/ROXICET) 5-325 MG per tablet Take 2 tablets by mouth every 4 (four) hours as needed for severe pain. 06/22/14   Nelva Nay, MD  QUEtiapine (SEROQUEL) 100 MG tablet Take 3 tablets (300 mg total) by mouth at bedtime. 06/22/14   Nelva Nay, MD   BP 127/78 mmHg  Pulse 100  Temp(Src) 98.6 F (37 C) (Oral)  Resp 18  Ht  (1.651 m)  Wt 238 lb (107.956 kg)  BMI 39.61 kg/m2  SpO2 100%  LMP 05/22/2014 (Approximate) Physical Exam  Constitutional: She is oriented to person, place, and time. She appears well-developed and well-nourished. No distress.  HENT:  Head: Normocephalic and atraumatic.  Eyes: Pupils are equal, round, and reactive to light.  Neck: Normal range of motion.  Cardiovascular: Normal rate and intact distal pulses.   Pulmonary/Chest: No respiratory distress.  Abdominal: Normal appearance. She exhibits no distension.  Musculoskeletal: Normal range of motion.  Neurological: She is alert  and oriented to person, place, and time. No cranial nerve deficit.  Skin: Skin is warm and dry. Rash noted.     Psychiatric: She has a normal mood and affect. Her behavior is normal.  Nursing note and vitals reviewed.   ED Course  Procedures   DIAGNOSTIC STUDIES: Oxygen Saturation is 100% on RA, normal by my interpretation.    COORDINATION OF CARE: 4:05 PM Discussed treatment plan with pt. She agreed to plan.   Labs Review   MDM   Final diagnoses:  Herpes zoster   I personally performed the services described in this documentation, which was scribed in my presence. The recorded information has been reviewed and considered.    Nelva Nayobert Ily Denno, MD 06/22/14 [phone]

## 2014-06-22 NOTE — Discharge Instructions (Signed)

## 2014-06-22 NOTE — ED Notes (Signed)
C/o rash to left leg-feels is shingles outbreak again-also requesting refill on seroquel

## 2014-06-25 ENCOUNTER — Emergency Department (HOSPITAL_BASED_OUTPATIENT_CLINIC_OR_DEPARTMENT_OTHER)
Admission: EM | Admit: 2014-06-25 | Discharge: 2014-06-25 | Disposition: A | Discharge status: Home / Self Care | Payer: 59 | Attending: Emergency Medicine | Admitting: Emergency Medicine

## 2014-06-25 ENCOUNTER — Encounter (HOSPITAL_BASED_OUTPATIENT_CLINIC_OR_DEPARTMENT_OTHER): Payer: Self-pay | Admitting: *Deleted

## 2014-06-25 DIAGNOSIS — R21 Rash and other nonspecific skin eruption: Secondary | ICD-10-CM | POA: Diagnosis present

## 2014-06-25 DIAGNOSIS — B029 Zoster without complications: Secondary | ICD-10-CM | POA: Diagnosis not present

## 2014-06-25 DIAGNOSIS — Z720 Tobacco use: Secondary | ICD-10-CM | POA: Insufficient documentation

## 2014-06-25 DIAGNOSIS — Z79899 Other long term (current) drug therapy: Secondary | ICD-10-CM | POA: Insufficient documentation

## 2014-06-25 DIAGNOSIS — J45909 Unspecified asthma, uncomplicated: Secondary | ICD-10-CM | POA: Insufficient documentation

## 2014-06-25 DIAGNOSIS — Z760 Encounter for issue of repeat prescription: Secondary | ICD-10-CM | POA: Diagnosis not present

## 2014-06-25 DIAGNOSIS — Z8679 Personal history of other diseases of the circulatory system: Secondary | ICD-10-CM | POA: Insufficient documentation

## 2014-06-25 DIAGNOSIS — F329 Major depressive disorder, single episode, unspecified: Secondary | ICD-10-CM | POA: Diagnosis not present

## 2014-06-25 DIAGNOSIS — Z880 Allergy status to penicillin: Secondary | ICD-10-CM | POA: Insufficient documentation

## 2014-06-25 DIAGNOSIS — F419 Anxiety disorder, unspecified: Secondary | ICD-10-CM | POA: Diagnosis not present

## 2014-06-25 MED ORDER — OXYCODONE-ACETAMINOPHEN 5-325 MG PO TABS: 1.0000 | ORAL_TABLET | Freq: Four times a day (QID) | ORAL | Status: DC | PRN

## 2014-06-25 NOTE — Discharge Instructions (Signed)
Shingles Shingles (herpes zoster) is an infection that is caused by the same virus that causes chickenpox (varicella). The infection causes a painful skin rash and fluid-filled blisters, which eventually break open, crust over, and heal. It may occur in any area of the body, but it usually affects only one side of the body or face. The pain of shingles usually lasts about 1 month. However, some people with shingles may develop long-term (chronic) pain in the affected area of the body. Shingles often occurs many years after the person had chickenpox. It is more common:  In people older than 50 years.  In people with weakened immune systems, such as those with HIV, AIDS, or cancer.  In people taking medicines that weaken the immune system, such as transplant medicines.  In people under great stress. CAUSES  Shingles is caused by the varicella zoster virus (VZV), which also causes chickenpox. After a person is infected with the virus, it can remain in the person's body for years in an inactive state (dormant). To cause shingles, the virus reactivates and breaks out as an infection in a nerve root. The virus can be spread from person to person (contagious) through contact with open blisters of the shingles rash. It will only spread to people who have not had chickenpox. When these people are exposed to the virus, they may develop chickenpox. They will not develop shingles. Once the blisters scab over, the person is no longer contagious and cannot spread the virus to others. SIGNS AND SYMPTOMS  Shingles shows up in stages. The initial symptoms may be pain, itching, and tingling in an area of the skin. This pain is usually described as burning, stabbing, or throbbing.In a few days or weeks, a painful red rash will appear in the area where the pain, itching, and tingling were felt. The rash is usually on one side of the body in a band or belt-like pattern. Then, the rash usually turns into fluid-filled  blisters. They will scab over and dry up in approximately 2-3 weeks. Flu-like symptoms may also occur with the initial symptoms, the rash, or the blisters. These may include:  Fever.  Chills.  Headache.  Upset stomach. DIAGNOSIS  Your health care provider will perform a skin exam to diagnose shingles. Skin scrapings or fluid samples may also be taken from the blisters. This sample will be examined under a microscope or sent to a lab for further testing. TREATMENT  There is no specific cure for shingles. Your health care provider will likely prescribe medicines to help you manage the pain, recover faster, and avoid long-term problems. This may include antiviral drugs, anti-inflammatory drugs, and pain medicines. HOME CARE INSTRUCTIONS   Take a cool bath or apply cool compresses to the area of the rash or blisters as directed. This may help with the pain and itching.   Take medicines only as directed by your health care provider.   Rest as directed by your health care provider.  Keep your rash and blisters clean with mild soap and cool water or as directed by your health care provider.  Do not pick your blisters or scratch your rash. Apply an anti-itch cream or numbing creams to the affected area as directed by your health care provider.  Keep your shingles rash covered with a loose bandage (dressing).  Avoid skin contact with:  Babies.   Pregnant women.   Children with eczema.   Elderly people with transplants.   People with chronic illnesses, such as leukemia  or AIDS.   Wear loose-fitting clothing to help ease the pain of material rubbing against the rash.  Keep all follow-up visits as directed by your health care provider.If the area involved is on your face, you may receive a referral for a specialist, such as an eye doctor (ophthalmologist) or an ear, nose, and throat (ENT) doctor. Keeping all follow-up visits will help you avoid eye problems, chronic pain, or  disability.  SEEK IMMEDIATE MEDICAL CARE IF:   You have facial pain, pain around the eye area, or loss of feeling on one side of your face.  You have ear pain or ringing in your ear.  You have loss of taste.  Your pain is not relieved with prescribed medicines.   Your redness or swelling spreads.   You have more pain and swelling.  Your condition is worsening or has changed.   You have a fever. MAKE SURE YOU:  Understand these instructions.  Will watch your condition.  Will get help right away if you are not doing well or get worse. Document Released: 02/20/2005 Document Revised: 07/07/2013 Document Reviewed: 10/05/2011 [employer] Patient Information 2015 [employer], [place]. This information is not intended to replace advice given to you by your health care provider. Make sure you discuss any questions you have with your health care provider.  Emergency Department Resource Guide 1) Find a Doctor and Pay Out of Pocket Although you won't have to find out who is covered by your insurance plan, it is a good idea to ask around and get recommendations. You will then need to call the office and see if the doctor you have chosen will accept you as a new patient and what types of options they offer for patients who are self-pay. Some doctors offer discounts or will set up payment plans for their patients who do not have insurance, but you will need to ask so you aren't surprised when you get to your appointment.  2) Contact Your Local Health Department Not all health departments have doctors that can see patients for sick visits, but many do, so it is worth a call to see if yours does. If you don't know where your local health department is, you can check in your phone book. The CDC also has a tool to help you locate your state's health department, and many state websites also have listings of all of their local health departments.  3) Find a Walk-in Clinic If your illness is not likely to  be very severe or complicated, you may want to try a walk in clinic. These are popping up all over the country in pharmacies, drugstores, and shopping centers. They're usually staffed by nurse practitioners or physician assistants that have been trained to treat common illnesses and complaints. They're usually fairly quick and inexpensive. However, if you have serious medical issues or chronic medical problems, these are probably not your best option.  No Primary Care Doctor: - Call Health Connect at  [phone] - they can help you locate a primary care doctor that  accepts your insurance, provides certain services, etc. - Physician Referral Service- [phone]  Chronic Pain Problems: Organization         Address  Phone   Notes  [facility]  [phone] Patients need to be referred by their primary care doctor.   Medication Assistance: Organization         Address  Phone   Notes  [facility] [address] [place], [place] [address] [phone] --Must be a resident of [facility] -- Must have NO insurance coverage whatsoever (no Medicaid/ Medicare, etc.) -- The pt. MUST have a primary care doctor that directs their care regularly and follows them in the community   [facility]ist  [phone]   [employer]  [phone]    Agencies that provide inexpensive medical care: Organization         Address  Phone   Notes  [facility]  [phone]   [facility]    [phone]   [facility] [address] [place], [place] [address] [phone]   [facility] [address], [place] [phone]   Planned Parenthood    [phone]   [facility]    [phone]   [facility]  [address], [place] Phone:  [phone], Fax:  [phone] Hours of Operation:  9 am - 6 pm, M-F.  Also  accepts Medicaid/Medicare and self-pay.  [facility]  [address], [place] Phone: [phone], Fax: [phone]. Hours of Operation:  8:30 am - 5:30 pm, M-F.  Also accepts Medicaid and self-pay.  [facility] [place] [address], [place] Phone: [phone]   [facility] [address] Natasha Bence [place], [place] [phone], Ext. [phone] Mondays & Thursdays: 7-9 AM.  First 15 patients are seen on a first come, first serve basis.    Medicaid-accepting [facility] Providers:  Organization         Address  Phone   Notes  [facility] [address], [place] [phone] Also accepts self-pay patients.  [facility] [address] Laurell Josephs [place], [place]  [phone]   [facility] [address], [place] [phone]   [facility] [address], [place] [phone]   Renaye Rakers [address], [place]   [phone] Only accepts [place] patients after they have their name applied to their card.   Self-Pay (no insurance) in [facility]:  Organization         Address  Phone   Notes  Sickle Cell Patients, [facility] [address] [place], [place] [phone]   [facility] [address] [place], [place] [phone]   [facility] [place]  [address], [place] [phone]   [facility]/Dr. Osei-Bonsu  [address] [place] or [address], [place] [phone] Phone number for both [place] and [place] locations is the same.  [facility] [address] [place] [phone]   [facility] [address], [place] or [address] [phone]   [facility] [address],  [place] [phone], phone; [phone], fax Sees patients 1st and 3rd Saturday of every month.  Must not qualify for public or private insurance (i.e. Medicaid, Medicare, [place] Health Choice, Veterans' Benefits)  Household income should be no more than 200% of the poverty level The clinic cannot treat you if you are pregnant or think you are pregnant  Sexually transmitted diseases are not treated at the clinic.  Dental Care: Organization         Address  Phone  Notes  [facility] [address], [place] [phone] Accepts children up to age 28 who are enrolled in [place]icaid or [place] Health Choice; pregnant women with a Medicaid card; and children who have applied for Medicaid or [place] Health Choice, but were declined, whose parents can pay a reduced fee at time of service.  [facility] [place]  [address], [place] [phone] Accepts children up to age 28 who are enrolled in [place] or [place] Health Choice; pregnant women with a Medicaid card; and children who have applied for Medicaid or [place] Health Choice, but were declined, whose parents can pay a reduced fee at time of service.  [facility]  [address], [place] [phone] Patients are seen by appointment only. Walk-ins are not accepted. [facility] will see patients 28 years of age and older. Monday - Tuesday (8am-5pm) Most Wednesdays (8:30-5pm) $30 per visit, cash only  [facility]  [address], [facility] [phone] Patients are seen by appointment only. Walk-ins are not accepted. [facility] will see patients 28 years of age and older. One Wednesday Evening (Monthly: Volunteer Based).  $30 per visit, cash only  [employer] [facility] [facility]  [phone] for adults; Children under age 704, call Graduate Pediatric Dentistry at [phone].  Children aged 584-14, please call [phone] to request a pediatric application.  Dental services are provided in all areas of dental care including fillings, crowns and bridges, complete and partial dentures, implants, gum treatment, root canals, and extractions. Preventive care is also provided. Treatment is provided to both adults and children. Patients are selected via a lottery and there is often a waiting list.   [facility] [address], [place]  [phone] [email or web address]   [facility] [address], [address] [place] [place], [place] [phone], Ext. [phone] Second and Fourth Thursday of each month, opens at 6:30 AM; Clinic ends at 9 AM.  Patients are seen on a first-come first-served basis, and a limited number are seen during each clinic.   [facility]  [address], [place], [place] [phone]   Eligibility Requirements You must have lived in [place], [place], or [place] counties for at least the last three months.   You cannot be eligible for state or federal sponsored National Cityhealthcare insurance, including CIGNAVeterans Administration, [place]icaid, or [employer] EntertainmentMedicare.   You generally cannot be eligible for healthcare insurance through your employer.    How to apply: Eligibility screenings are held every Tuesday and Wednesday afternoon from 1:00 pm until 4:00 pm. You do not need an appointment for the interview!  [facility] [address], [place], [place] [phone]   [facility]  [phone]   [facility]  [phone]   [facility]  [phone]    Behavioral Health Resources in the Community: Intensive Outpatient Programs Organization         Address  Phone  Notes  [facility] [address], [place], [place] [phone]   [facility] [address], [place], [place] [phone]   [facility] [address], [place], [place]  [phone]   [facility] [address],  [place], [place] [phone] or [phone]   Substance Abuse Resources Organization         Address  Phone  Notes  [facility]  [phone]   [facility]  [phone]   [facility]  [phone]   [facility]  [phone]   Residential & Outpatient Substance Abuse Program  [phone]   Psychological Services Organization         Address  Phone  Notes  [facility]  [phone]- [phone]   [facility]  [phone]- [phone]   [facility] [address], [place] [phone] or [phone]    Mobile Crisis Teams Organization         Address  Phone  Notes  [facility], Mobile Crisis Care Unit  [phone]   [facility]  [address] [place], [place] [phone]   Doristine LocksSharon DeEsch [address] [place] [phone]    Self-Help/Support Groups Organization         Address  Phone             Notes  [facility] [place] - variety of support groups  336- [phone] Call for more information  Narcotics Anonymous (NA), [facility] [address], [place]  2 meetings at this location   Statisticianesidential Treatment Programs Organization         Address  Phone  Notes  [facility] [address],    [place]  [phone]   [facility]  [address], [place] [phone], [place], [place] [phone]   [facility] [address], [place] [phone] Admissions: 8am-3pm M-F  [facility] [address]    [place], [place] [phone]   [facility] [address], [place], [place] [phone]   [facility] [address]  [place], [place] [phone]   [facility] - Intensive Outpatient [address], [place], [place] [phone]     [facility] ([facility] [address]  [place], [place] [phone] or [phone]   [facility] ([facility]) [address] [place], [place] [phone] Accepts Medicaid  [facility] [address]  [place] [phone] Substance Abuse/Addiction Treatment   [facility] Resources Organization         Address  Phone  Notes  [facility]  [phone]   Angie FavaJulie Brannon, PhD [address], [address] [place], [place]   [phone] or [phone]) [phone]   [facility]   [address] [place], [place] [phone]   [facility] [address], [place], [place] [phone] Insurance/Medicaid/sponsorship through [facility]  [facility] [address]                                    [place], [place] [phone] Therapy/tele-psych/case  [facility] [address]   [place], [place] [phone]    Dr. Lolly MustacheArfeen  [phone]   [facility]  [employer] [facility] 1) [address], [place] 2) [address], [place] 3)  [address], [place] [phone]  [phone]   [facility] Child Abuse Hotline [phone] or [phone]) [phone] (After Hours)

## 2014-06-25 NOTE — ED Notes (Signed)
Presents to ED for refill of pain medicine, here in this ED , recd dx of shingles

## 2014-06-25 NOTE — ED Notes (Signed)
States she ran out of pain medication and needs a refil.

## 2014-06-25 NOTE — ED Notes (Signed)
MD at bedside. 

## 2014-06-25 NOTE — ED Provider Notes (Signed)
CSN: [id number]     Arrival date & time 06/25/14  1846 History   First MD Initiated Contact with Patient 06/25/14 1938     Chief Complaint  Patient presents with  . Rash     (Consider location/radiation/quality/duration/timing/severity/associated sxs/prior Treatment) HPI Comments: Pt comes in today for a refill of her pain medication. Pt was diagnosed with shingles in the er 3 days ago and is out of her pain medication. Pt is continuing to have pain. Rash is to the left thigh. Denies fever  The history is provided by the patient. No language interpreter was used.    Past Medical History  Diagnosis Date  . Asthma   . Shingles 2012, May 2014    left arm  . Anxiety   . Headache(784.0)   . Migraine   . Anxiety   . Depression    Past Surgical History  Procedure Laterality Date  . 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 Other Neg Hx    History  Substance Use Topics  . Smoking status: Current Every Day Smoker -- 1.00 packs/day for 3 years    Types: Cigarettes  . Smokeless tobacco: Never Used  . Alcohol Use: Yes   OB History    Gravida Para Term Preterm AB TAB SAB Ectopic Multiple Living   0              Review of Systems  All other systems reviewed and are negative.     Allergies  Imitrex and Penicillins  Home Medications   Prior to Admission medications   Medication Sig Start Date End Date Taking? Authorizing Provider  albuterol (PROVENTIL HFA;VENTOLIN HFA) 108 (90 BASE) MCG/ACT inhaler Inhale 2 puffs into the lungs every 4 (four) hours as needed. For asthma 09/25/12   Sanjuana KavaAgnes I Nwoko, NP  carbamazepine (TEGRETOL XR) 200 MG 12 hr tablet Take 1 tablet (200 mg) daily and 2 tablets (400 mg) Q bedtime: For mood stabilization Patient not taking: Reported on 01/22/2014 09/25/12   Sanjuana KavaAgnes I Nwoko, NP  famciclovir (FAMVIR) 500 MG tablet Take 1 tablet (500 mg total) by mouth 3 (three) times daily. 06/22/14   Nelva Nayobert  Beaton, MD  oxyCODONE-acetaminophen (PERCOCET/ROXICET) 5-325 MG per tablet Take 1-2 tablets by mouth every 6 (six) hours as needed for severe pain. 06/25/14   Teressa LowerVrinda Teri Legacy, NP  QUEtiapine (SEROQUEL) 100 MG tablet Take 3 tablets (300 mg total) by mouth at bedtime. 06/22/14   Nelva Nayobert Beaton, MD   BP 119/76 mmHg  Pulse 108  Temp(Src) 99.2 F (37.3 C) (Oral)  Resp 20  Ht 5\' 5"  (1.651 m)  Wt 238 lb (107.956 kg)  BMI 39.61 kg/m2  SpO2 100%  LMP 05/22/2014 (Approximate) Physical Exam  Constitutional: She is oriented to person, place, and time.  Cardiovascular: Normal rate and regular rhythm.   Pulmonary/Chest: Effort normal and breath sounds normal.  Neurological: She is alert and oriented to person, place, and time.  Skin:  erythematous rash to the left thigh with vesicles  Nursing note and vitals reviewed.   ED Course  Procedures (including critical care time) Labs Review Labs Reviewed - No data to display  Imaging Review No results found.   EKG Interpretation None      MDM   Final diagnoses:  Shingles  Medication refill    Pt given another script for oxycodone and given primary care follow up. Rash consistent  with shingles    Teressa Lower, NP 06/25/14 2229  Elwin Mocha, MD 06/25/14 2330

## 2014-06-30 ENCOUNTER — Telehealth: Payer: Self-pay

## 2014-06-30 NOTE — Telephone Encounter (Signed)
No Answer no voicemail

## 2014-07-01 ENCOUNTER — Ambulatory Visit: Payer: 59 | Admitting: Physician Assistant

## 2014-07-01 DIAGNOSIS — Z0289 Encounter for other administrative examinations: Secondary | ICD-10-CM

## 2014-07-02 ENCOUNTER — Telehealth: Payer: Self-pay | Admitting: *Deleted

## 2014-07-02 NOTE — Telephone Encounter (Signed)
Unable to reach patient at time of Pre-Visit Call.  Unable to leave message, no voicemail.

## 2014-07-03 ENCOUNTER — Ambulatory Visit: Payer: 59 | Admitting: Physician Assistant

## 2014-07-03 DIAGNOSIS — Z0289 Encounter for other administrative examinations: Secondary | ICD-10-CM

## 2016-01-25 ENCOUNTER — Encounter (HOSPITAL_COMMUNITY): Payer: Self-pay | Admitting: *Deleted

## 2016-01-25 ENCOUNTER — Inpatient Hospital Stay (HOSPITAL_COMMUNITY): Payer: Self-pay

## 2016-01-25 ENCOUNTER — Inpatient Hospital Stay (HOSPITAL_COMMUNITY)
Admission: AD | Admit: 2016-01-25 | Discharge: 2016-01-29 | DRG: 781 | Disposition: A | Discharge status: Home / Self Care | Payer: Self-pay | Source: Ambulatory Visit | Attending: Obstetrics & Gynecology | Admitting: Obstetrics & Gynecology

## 2016-01-25 DIAGNOSIS — F17210 Nicotine dependence, cigarettes, uncomplicated: Secondary | ICD-10-CM | POA: Diagnosis present

## 2016-01-25 DIAGNOSIS — O2300 Infections of kidney in pregnancy, unspecified trimester: Secondary | ICD-10-CM

## 2016-01-25 DIAGNOSIS — O99281 Endocrine, nutritional and metabolic diseases complicating pregnancy, first trimester: Secondary | ICD-10-CM | POA: Diagnosis present

## 2016-01-25 DIAGNOSIS — O99331 Smoking (tobacco) complicating pregnancy, first trimester: Secondary | ICD-10-CM | POA: Diagnosis present

## 2016-01-25 DIAGNOSIS — Z3A01 Less than 8 weeks gestation of pregnancy: Secondary | ICD-10-CM

## 2016-01-25 DIAGNOSIS — E876 Hypokalemia: Secondary | ICD-10-CM | POA: Diagnosis present

## 2016-01-25 DIAGNOSIS — O99511 Diseases of the respiratory system complicating pregnancy, first trimester: Secondary | ICD-10-CM | POA: Diagnosis present

## 2016-01-25 DIAGNOSIS — J45909 Unspecified asthma, uncomplicated: Secondary | ICD-10-CM | POA: Diagnosis present

## 2016-01-25 DIAGNOSIS — R109 Unspecified abdominal pain: Secondary | ICD-10-CM

## 2016-01-25 DIAGNOSIS — O26899 Other specified pregnancy related conditions, unspecified trimester: Secondary | ICD-10-CM

## 2016-01-25 DIAGNOSIS — Z3491 Encounter for supervision of normal pregnancy, unspecified, first trimester: Secondary | ICD-10-CM

## 2016-01-25 DIAGNOSIS — O219 Vomiting of pregnancy, unspecified: Secondary | ICD-10-CM

## 2016-01-25 DIAGNOSIS — O2301 Infections of kidney in pregnancy, first trimester: Principal | ICD-10-CM | POA: Diagnosis present

## 2016-01-25 HISTORY — DX: Major depressive disorder, recurrent severe without psychotic features: F33.2

## 2016-01-25 HISTORY — DX: Migraine, unspecified, not intractable, without status migrainosus: G43.909

## 2016-01-25 HISTORY — DX: Alcohol dependence, uncomplicated: F10.20

## 2016-01-25 HISTORY — DX: Cannabis dependence, uncomplicated: F12.20

## 2016-01-25 LAB — CBC
HCT: 34.3 % — ABNORMAL LOW (ref 36.0–46.0)
Hemoglobin: 11.7 g/dL — ABNORMAL LOW (ref 12.0–15.0)
MCH: 28 pg (ref 26.0–34.0)
MCHC: 34.1 g/dL (ref 30.0–36.0)
MCV: 82.1 fL (ref 78.0–100.0)
PLATELETS: 317 10*3/uL (ref 150–400)
RBC: 4.18 MIL/uL (ref 3.87–5.11)
RDW: 13.4 % (ref 11.5–15.5)
WBC: 11.6 10*3/uL — ABNORMAL HIGH (ref 4.0–10.5)

## 2016-01-25 LAB — POCT PREGNANCY, URINE: Preg Test, Ur: POSITIVE — AB

## 2016-01-25 LAB — WET PREP, GENITAL
SPERM: NONE SEEN
Trich, Wet Prep: NONE SEEN
Yeast Wet Prep HPF POC: NONE SEEN

## 2016-01-25 LAB — URINALYSIS, ROUTINE W REFLEX MICROSCOPIC
Glucose, UA: NEGATIVE mg/dL
Ketones, ur: 15 mg/dL — AB
Nitrite: POSITIVE — AB
PROTEIN: 100 mg/dL — AB
pH: 6 (ref 5.0–8.0)

## 2016-01-25 LAB — URINE MICROSCOPIC-ADD ON

## 2016-01-25 LAB — ABO/RH: ABO/RH(D): O POS

## 2016-01-25 LAB — HCG, QUANTITATIVE, PREGNANCY: hCG, Beta Chain, Quant, S: 19650 m[IU]/mL — ABNORMAL HIGH (ref <5)

## 2016-01-25 MED ORDER — PROMETHAZINE HCL 25 MG/ML IJ SOLN
25.0000 mg | Freq: Once | INTRAMUSCULAR | Status: AC
  Administered 2016-01-25: 25 mg via INTRAVENOUS
  Filled 2016-01-25: qty 1

## 2016-01-25 MED ORDER — ALBUTEROL SULFATE (2.5 MG/3ML) 0.083% IN NEBU
3.0000 mL | INHALATION_SOLUTION | RESPIRATORY_TRACT | Status: DC | PRN
Start: 2016-01-25 — End: 2016-01-29

## 2016-01-25 MED ORDER — SODIUM CHLORIDE 0.9 % IV BOLUS (SEPSIS)
500.0000 mL | Freq: Once | INTRAVENOUS | Status: AC
  Administered 2016-01-25: 500 mL via INTRAVENOUS

## 2016-01-25 MED ORDER — ZOLPIDEM TARTRATE 5 MG PO TABS
5.0000 mg | ORAL_TABLET | Freq: Every evening | ORAL | Status: DC | PRN
  Administered 2016-01-25 – 2016-01-29 (×4): 5 mg via ORAL
  Filled 2016-01-25 (×6): qty 1

## 2016-01-25 MED ORDER — PRENATAL MULTIVITAMIN CH
1.0000 | ORAL_TABLET | Freq: Every day | ORAL | Status: DC
  Administered 2016-01-28: 1 via ORAL
  Filled 2016-01-25: qty 1

## 2016-01-25 MED ORDER — PROMETHAZINE HCL 25 MG/ML IJ SOLN
25.0000 mg | Freq: Four times a day (QID) | INTRAMUSCULAR | Status: DC | PRN
  Administered 2016-01-25 – 2016-01-28 (×8): 25 mg via INTRAVENOUS
  Filled 2016-01-25 (×8): qty 1

## 2016-01-25 MED ORDER — CEPHALEXIN 500 MG PO CAPS: 500.0000 mg | ORAL_CAPSULE | Freq: Four times a day (QID) | ORAL | 0 refills | Status: DC

## 2016-01-25 MED ORDER — SODIUM CHLORIDE 0.9 % IV SOLN
INTRAVENOUS | Status: DC
  Administered 2016-01-25 – 2016-01-26 (×3): via INTRAVENOUS
  Administered 2016-01-26: 125 mL/h via INTRAVENOUS
  Administered 2016-01-27 (×2): via INTRAVENOUS

## 2016-01-25 MED ORDER — PHENAZOPYRIDINE HCL 100 MG PO TABS
200.0000 mg | ORAL_TABLET | Freq: Three times a day (TID) | ORAL | Status: DC
  Administered 2016-01-25 – 2016-01-27 (×5): 200 mg via ORAL
  Filled 2016-01-25 (×11): qty 2

## 2016-01-25 MED ORDER — ACETAMINOPHEN 500 MG PO TABS
1000.0000 mg | ORAL_TABLET | Freq: Four times a day (QID) | ORAL | Status: DC | PRN
  Administered 2016-01-26 – 2016-01-28 (×8): 1000 mg via ORAL
  Filled 2016-01-25 (×8): qty 2

## 2016-01-25 MED ORDER — OXYCODONE HCL 5 MG PO TABS
5.0000 mg | ORAL_TABLET | ORAL | Status: DC | PRN
  Administered 2016-01-25 – 2016-01-26 (×2): 10 mg via ORAL
  Administered 2016-01-26: 5 mg via ORAL
  Filled 2016-01-25: qty 2
  Filled 2016-01-25: qty 1
  Filled 2016-01-25 (×2): qty 2

## 2016-01-25 MED ORDER — ACETAMINOPHEN 500 MG PO TABS
1000.0000 mg | ORAL_TABLET | Freq: Once | ORAL | Status: AC
  Administered 2016-01-25: 1000 mg via ORAL
  Filled 2016-01-25: qty 2

## 2016-01-25 MED ORDER — CALCIUM CARBONATE ANTACID 500 MG PO CHEW
2.0000 | CHEWABLE_TABLET | ORAL | Status: DC | PRN
  Administered 2016-01-26 – 2016-01-27 (×6): 400 mg via ORAL
  Filled 2016-01-25 (×6): qty 2

## 2016-01-25 MED ORDER — DEXTROSE 5 % IV SOLN
2.0000 g | Freq: Once | INTRAVENOUS | Status: AC
  Administered 2016-01-25: 2 g via INTRAVENOUS
  Filled 2016-01-25: qty 2

## 2016-01-25 MED ORDER — DOCUSATE SODIUM 100 MG PO CAPS
100.0000 mg | ORAL_CAPSULE | Freq: Every day | ORAL | Status: DC
  Filled 2016-01-25: qty 1

## 2016-01-25 MED ORDER — PROMETHAZINE HCL 25 MG PO TABS: 25.0000 mg | ORAL_TABLET | Freq: Four times a day (QID) | ORAL | 0 refills | Status: DC | PRN

## 2016-01-25 MED ORDER — DEXTROSE 5 % IV SOLN: 2.0000 g | Freq: Once | INTRAVENOUS | Status: DC

## 2016-01-25 MED ORDER — METOCLOPRAMIDE HCL 5 MG/ML IJ SOLN
10.0000 mg | Freq: Four times a day (QID) | INTRAMUSCULAR | Status: DC | PRN
  Administered 2016-01-26 – 2016-01-27 (×6): 10 mg via INTRAVENOUS
  Filled 2016-01-25 (×7): qty 2

## 2016-01-25 NOTE — MAU Note (Signed)
CHills and sweating since yesterday. Lower back pain.  LLQ pain for several days. Denies vag bleeding, some white vag d/c. No diarrhea.

## 2016-01-25 NOTE — MAU Provider Note (Signed)
History     CSN: [id number]  Arrival date and time: 01/25/16 1426      Chief Complaint  Patient presents with  . Chills  . Emesis During Pregnancy  . Back Pain  . Abdominal Pain   Back Pain  This is a new problem. The current episode started yesterday. The problem occurs constantly. The problem is unchanged. The pain is present in the lumbar spine. The quality of the pain is described as aching and cramping. The pain is at a severity of 8/10. The pain is moderate. The pain is the same all the time. Associated symptoms include abdominal pain, dysuria and a fever. Pertinent negatives include no chest pain, headaches or weight loss. Risk factors include pregnancy. She has tried nothing for the symptoms. The treatment provided no relief.  Abdominal Pain  This is a new problem. The current episode started in the past 7 days. The onset quality is sudden. The problem occurs intermittently. The pain is located in the LUQ and LLQ. The pain is at a severity of 9/10. The pain is mild. The quality of the pain is cramping. The abdominal pain radiates to the right flank and left flank. Associated symptoms include constipation, dysuria, a fever, frequency, nausea and vomiting. Pertinent negatives include no diarrhea, headaches, hematuria or weight loss. Nothing aggravates the pain. The pain is relieved by nothing. Treatments tried: advil. The treatment provided moderate relief. There is no history of abdominal surgery.  She hasn't eaten since last night. Her last episode of emesis was just before she arrived at MAU.   OB History    Gravida Para Term Preterm AB Living   1             SAB TAB Ectopic Multiple Live Births                  Past Medical History:  Diagnosis Date  . Anxiety   . Anxiety   . Asthma   . Depression   . Headache(784.0)   . Migraine   . Shingles 2012, May 2014   left arm    Past Surgical History:  Procedure Laterality Date  . 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 Cancer Paternal Grandfather   . Other Neg Hx     Social History  Substance Use Topics  . Smoking status: Current Every Day Smoker    Packs/day: 1.00    Years: 3.00    Types: Cigarettes  . Smokeless tobacco: Never Used  . Alcohol use Yes    Allergies:  Allergies  Allergen Reactions  . Imitrex [Sumatriptan] Other (See Comments)    Increases migraine  . Penicillins Other (See Comments)    Childhood reaction    Prescriptions Prior to Admission  Medication Sig Dispense Refill Last Dose  . albuterol (PROVENTIL HFA;VENTOLIN HFA) 108 (90 BASE) MCG/ACT inhaler Inhale 2 puffs into the lungs every 4 (four) hours as needed. For asthma   Past Month at Unknown time  . carbamazepine (TEGRETOL XR) 200 MG 12 hr tablet Take 1 tablet (200 mg) daily and 2 tablets (400 mg) Q bedtime: For mood stabilization (Patient not taking: Reported on 01/22/2014) 90 tablet 0 no  . famciclovir (FAMVIR) 500 MG tablet Take 1 tablet (500 mg total) by mouth 3 (three) times daily. 21 tablet 0   . oxyCODONE-acetaminophen (PERCOCET/ROXICET) 5-325 MG per tablet Take 1-2 tablets by  mouth every 6 (six) hours as needed for severe pain. 15 tablet 0   . QUEtiapine (SEROQUEL) 100 MG tablet Take 3 tablets (300 mg total) by mouth at bedtime. 90 tablet 0     Review of Systems  Constitutional: Positive for chills, diaphoresis, fever and malaise/fatigue. Negative for weight loss.  Cardiovascular: Negative for chest pain and palpitations.  Gastrointestinal: Positive for abdominal pain, constipation, nausea and vomiting. Negative for diarrhea.  Genitourinary: Positive for dysuria, flank pain, frequency and urgency. Negative for hematuria.  Musculoskeletal: Positive for back pain.  Neurological: Positive for dizziness. Negative for headaches.   Physical Exam   Blood pressure 104/56, pulse 114, temperature 99.5 F (37.5 C), temperature source Oral, resp. rate 18,  height 5\' 5"  (1.651 m), weight 94.2 kg (207 lb 9.6 oz), last menstrual period 12/16/2015, SpO2 99 %.  Physical Exam  Constitutional: She is oriented to person, place, and time. She appears well-developed and well-nourished.  Eyes: EOM are normal.  Neck: Normal range of motion.  Respiratory: Effort normal.  GI: Soft. Normal appearance. She exhibits no distension, no abdominal bruit, no ascites, no pulsatile midline mass and no mass. There is no tenderness. There is no rebound and no guarding.  Neurological: She is alert and oriented to person, place, and time. GCS eye subscore is 4. GCS verbal subscore is 5. GCS motor subscore is 6.  Skin: Skin is warm and dry.  Psychiatric: She has a normal mood and affect. Her behavior is normal. Judgment and thought content normal.  No CVA tenderness.   MAU Course  Procedures Results for orders placed or performed during the hospital encounter of 01/25/16 (from the past 24 hour(s))  Urinalysis, Routine w reflex microscopic (not at [facility])     Status: Abnormal   Collection Time: 01/25/16  2:51 PM  Result Value Ref Range   Color, Urine AMBER (A) YELLOW   APPearance CLOUDY (A) CLEAR   Specific Gravity, Urine >1.030 (H) 1.005 - 1.030   pH 6.0 5.0 - 8.0   Glucose, UA NEGATIVE NEGATIVE mg/dL   Hgb urine dipstick MODERATE (A) NEGATIVE   Bilirubin Urine SMALL (A) NEGATIVE   Ketones, ur 15 (A) NEGATIVE mg/dL   Protein, ur 086100 (A) NEGATIVE mg/dL   Nitrite POSITIVE (A) NEGATIVE   Leukocytes, UA MODERATE (A) NEGATIVE  Urine microscopic-add on     Status: Abnormal   Collection Time: 01/25/16  2:51 PM  Result Value Ref Range   Squamous Epithelial / LPF 6-30 (A) NONE SEEN   WBC, UA TOO NUMEROUS TO COUNT 0 - 5 WBC/hpf   RBC / HPF 0-5 0 - 5 RBC/hpf   Bacteria, UA MANY (A) NONE SEEN  Pregnancy, urine POC     Status: Abnormal   Collection Time: 01/25/16  2:58 PM  Result Value Ref Range   Preg Test, Ur POSITIVE (A) NEGATIVE  Wet prep, genital     Status:  Abnormal   Collection Time: 01/25/16  3:49 PM  Result Value Ref Range   Yeast Wet Prep HPF POC NONE SEEN NONE SEEN   Trich, Wet Prep NONE SEEN NONE SEEN   Clue Cells Wet Prep HPF POC PRESENT (A) NONE SEEN   WBC, Wet Prep HPF POC MODERATE (A) NONE SEEN   Sperm NONE SEEN   ABO/Rh     Status: None (Preliminary result)   Collection Time: 01/25/16  3:54 PM  Result Value Ref Range   ABO/RH(D) O POS   CBC     Status:  Abnormal   Collection Time: 01/25/16  3:55 PM  Result Value Ref Range   WBC 11.6 (H) 4.0 - 10.5 K/uL   RBC 4.18 3.87 - 5.11 MIL/uL   Hemoglobin 11.7 (L) 12.0 - 15.0 g/dL   HCT 16.1 (L) 09.6 - 04.5 %   MCV 82.1 78.0 - 100.0 fL   MCH 28.0 26.0 - 34.0 pg   MCHC 34.1 30.0 - 36.0 g/dL   RDW 40.9 81.1 - 91.4 %   Platelets 317 150 - 400 K/uL  hCG, quantitative, pregnancy     Status: Abnormal   Collection Time: 01/25/16  3:55 PM  Result Value Ref Range   hCG, Beta Chain, Quant, S 19,650 (H) <5 mIU/mL    MDM Patient has history of taking cephalosporins without reaction, prescribed IV ceftriaxone 2g for suspected pyelonephritis during pregnancy, and Phenergan 25mg  IV. Patient able to tolerate PO Tylenol 1000mg , ginger ale and graham crackers.   Assessment and Plan  1. Pyelonephritis in Pregnancy  - Discharged home.  - Prescribed Keflex 500mg  by mouth QID for one week.  - Phenergan 25mg  by mouth every 6 hours as needed for nausea.  - Patient educated on reasons to return to MAU.   Cala Bradford Malya Cirillo 01/25/2016, 7:10 PM

## 2016-01-25 NOTE — MAU Provider Note (Signed)
History     CSN: [id number]  Arrival date and time: 01/25/16 1426   First Provider Initiated Contact with Patient 01/25/16 1535      Chief Complaint  Patient presents with  . Urinary Tract Infection  . Emesis During Pregnancy  . Abdominal Pain   Evelyn DeedsSasha Fields is a 30 y.o. G1P0 at 5424w5d by LMP who presents with dysuria & abdominal pain. Symptoms began 3 days ago. Reports lower abdominal cramping & lower abdominal pressure with urination. Denies vaginal bleeding or vaginal discharge. Rates abdominal pain 9/10. Has not treated. Nothing makes better or worse.    Urinary Tract Infection   This is a new problem. Episode onset: x3 days. The problem occurs every urination. The problem has been gradually worsening. The quality of the pain is described as aching. The pain is at a severity of 9/10. Maximum temperature: + chills; pt didn't check temperature. The fever has been present for less than 1 day. She is sexually active. There is no history of pyelonephritis. Associated symptoms include chills, a discharge, frequency, nausea, a possible pregnancy, sweats, urgency and vomiting. Pertinent negatives include no flank pain, hematuria or hesitancy. Associated symptoms comments: + low back pain. She has tried home medications (azo) for the symptoms. The treatment provided no relief. There is no history of catheterization, kidney stones, recurrent UTIs, a single kidney or a urological procedure.    OB History    Gravida Para Term Preterm AB Living   1             SAB TAB Ectopic Multiple Live Births                  Past Medical History:  Diagnosis Date  . Alcohol dependence (HCC) 09/17/2012  . Anxiety   . Anxiety   . Asthma   . Depression   . Headache(784.0)   . Marijuana dependence (HCC) 09/17/2012  . Migraine   . Migraine, unspecified, without mention of intractable migraine without mention of status migrainosus 08/08/2012  . 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 Shingles 2012, May 2014   left arm    Past Surgical History:  Procedure Laterality Date  . 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 Cancer Paternal Grandfather   . Other Neg Hx     Social History  Substance Use Topics  . Smoking status: Current Every Day Smoker    Packs/day: 1.00    Years: 3.00    Types: Cigarettes  . Smokeless tobacco: Never Used  . Alcohol use Yes    Allergies:  Allergies  Allergen Reactions  . Imitrex [Sumatriptan] Other (See Comments)    Increases migraine  . Penicillins Other (See Comments)    Unknown Childhood reaction Has tolerated Ceftriaxone in past    Prescriptions Prior to Admission  Medication Sig Dispense Refill Last Dose  . Multiple Vitamin (MULTIVITAMIN WITH MINERALS) TABS tablet Take 1 tablet by mouth daily.   Past Month at Unknown time  . Phenazopyridine HCl (AZO TABS PO) Take 2 tablets by mouth 2 (two) times daily as needed.   01/24/2016 at Unknown time  . Probiotic Product (PROBIOTIC PO) Take 2 each by mouth daily.   Past Month at Unknown time  . [DISCONTINUED] ibuprofen (ADVIL,MOTRIN) 200 MG tablet Take 400 mg by mouth every 6 (six) hours as needed for mild pain.   01/24/2016  at Unknown time  . albuterol (PROVENTIL HFA;VENTOLIN HFA) 108 (90 BASE) MCG/ACT inhaler Inhale 2 puffs into the lungs every 4 (four) hours as needed. For asthma   Rescue    Review of Systems  Constitutional: Positive for chills.       Didn't check temp at home  Gastrointestinal: Positive for abdominal pain, nausea and vomiting. Negative for constipation and diarrhea.  Genitourinary: Positive for dysuria, frequency and urgency. Negative for flank pain, hematuria and hesitancy.       No vaginal bleeding  Musculoskeletal: Positive for back pain and myalgias.   Physical Exam   Blood pressure (!) 100/43, pulse 108, temperature 101.2 F (38.4 C), temperature source Oral, resp. rate  18, height 5\' 5"  (1.651 m), weight 207 lb 9.6 oz (94.2 kg), last menstrual period 12/16/2015, SpO2 99 %.  Temp:  [99 F (37.2 C)-101.2 F (38.4 C)] 101.2 F (38.4 C) (11/21 1914) Pulse Rate:  [108-121] 108 (11/21 1914) Resp:  [18] 18 (11/21 1914) BP: (100-112)/(43-74) 100/43 (11/21 1914) SpO2:  [99 %] 99 % (11/21 1657) Weight:  [207 lb 9.6 oz (94.2 kg)] 207 lb 9.6 oz (94.2 kg) (11/21 1443)   Physical Exam  Nursing note and vitals reviewed. Constitutional: She is oriented to person, place, and time. She appears well-developed and well-nourished. She appears distressed.  HENT:  Head: Normocephalic and atraumatic.  Eyes: Conjunctivae are normal. Right eye exhibits no discharge. Left eye exhibits no discharge. No scleral icterus.  Neck: Normal range of motion.  Cardiovascular: Normal rate, regular rhythm and normal heart sounds.   No murmur heard. Respiratory: Effort normal and breath sounds normal. No respiratory distress. She has no wheezes.  GI: Soft. There is no tenderness. There is no CVA tenderness.  Neurological: She is alert and oriented to person, place, and time.  Skin: Skin is warm and dry. She is not diaphoretic.  Psychiatric: She has a normal mood and affect. Her behavior is normal. Judgment and thought content normal.    MAU Course  Procedures Results for orders placed or performed during the hospital encounter of 01/25/16 (from the past 24 hour(s))  Urinalysis, Routine w reflex microscopic ([facility])     Status: Abnormal   Collection Time: 01/25/16  2:51 PM  Result Value Ref Range   Color, Urine AMBER (A) YELLOW   APPearance CLOUDY (A) CLEAR   Specific Gravity, Urine >1.030 (H) 1.005 - 1.030   pH 6.0 5.0 - 8.0   Glucose, UA NEGATIVE NEGATIVE mg/dL   Hgb urine dipstick MODERATE (A) NEGATIVE   Bilirubin Urine SMALL (A) NEGATIVE   Ketones, ur 15 (A) NEGATIVE mg/dL   Protein, ur 161100 (A) NEGATIVE mg/dL   Nitrite POSITIVE (A) NEGATIVE   Leukocytes, UA MODERATE (A)  NEGATIVE  Urine microscopic-add on     Status: Abnormal   Collection Time: 01/25/16  2:51 PM  Result Value Ref Range   Squamous Epithelial / LPF 6-30 (A) NONE SEEN   WBC, UA TOO NUMEROUS TO COUNT 0 - 5 WBC/hpf   RBC / HPF 0-5 0 - 5 RBC/hpf   Bacteria, UA MANY (A) NONE SEEN  Pregnancy, urine POC     Status: Abnormal   Collection Time: 01/25/16  2:58 PM  Result Value Ref Range   Preg Test, Ur POSITIVE (A) NEGATIVE  Wet prep, genital     Status: Abnormal   Collection Time: 01/25/16  3:49 PM  Result Value Ref Range   Yeast Wet Prep HPF POC NONE SEEN  NONE SEEN   Trich, Wet Prep NONE SEEN NONE SEEN   Clue Cells Wet Prep HPF POC PRESENT (A) NONE SEEN   WBC, Wet Prep HPF POC MODERATE (A) NONE SEEN   Sperm NONE SEEN   ABO/Rh     Status: None (Preliminary result)   Collection Time: 01/25/16  3:54 PM  Result Value Ref Range   ABO/RH(D) O POS   CBC     Status: Abnormal   Collection Time: 01/25/16  3:55 PM  Result Value Ref Range   WBC 11.6 (H) 4.0 - 10.5 K/uL   RBC 4.18 3.87 - 5.11 MIL/uL   Hemoglobin 11.7 (L) 12.0 - 15.0 g/dL   HCT 40.9 (L) 81.1 - 91.4 %   MCV 82.1 78.0 - 100.0 fL   MCH 28.0 26.0 - 34.0 pg   MCHC 34.1 30.0 - 36.0 g/dL   RDW 78.2 95.6 - 21.3 %   Platelets 317 150 - 400 K/uL  hCG, quantitative, pregnancy     Status: Abnormal   Collection Time: 01/25/16  3:55 PM  Result Value Ref Range   hCG, Beta Chain, Quant, S 19,650 (H) <5 mIU/mL    MDM +UPT UA, wet prep, GC/chlamydia, CBC, ABO/Rh, quant hCG, HIV, and [place] today to rule out ectopic pregnancy Ultrasound shows SIUP with cardiac activity IV ceftriaxone 2 gm for suspected pyelo Phenergan 25 mg IV Tylenol 1 gm PO Pt able to tolerate POs, no leukocytosis, no flank pain, afebrile, & no CVA tenderness. Feel comfortable discharging home on oral antibiotics  At times of discharge temp increased to 101.2 -- D/w Dr. Macon Large who will admit pt to [facility] unit for pyelonephritis.    Assessment and Plan  A: 1.  Pyelonephritis affecting pregnancy in first trimester   2. Abdominal pain affecting pregnancy   3. Nausea and vomiting during pregnancy   4. Normal IUP (intrauterine pregnancy) on prenatal ultrasound, first trimester    P: Admit for IV abx  Judeth Horn 01/25/2016, 8:01 PM

## 2016-01-25 NOTE — H&P (Signed)
History and Physical Note     Chief Complaint  Patient presents with  . Urinary Tract Infection  . Emesis During Pregnancy  . Abdominal Pain   Evelyn Fields is a 30 y.o. G1P0 at [redacted]w[redacted]d by LMP who presents with dysuria & abdominal pain. Symptoms began 3 days ago. Reports lower abdominal cramping & lower abdominal pressure with urination. Denies vaginal bleeding or vaginal discharge. Rates abdominal pain 9/10. Has not treated. Nothing makes better or worse.   Urinary Tract Infection   This is a new problem. Episode onset: x3 days. The problem occurs every urination. The problem has been gradually worsening. The quality of the pain is described as aching. The pain is at a severity of 9/10. Maximum temperature: + chills; pt didn't check temperature. The fever has been present for less than 1 day. She is sexually active. There is no history of pyelonephritis. Associated symptoms include chills, a discharge, frequency, nausea, a possible pregnancy, sweats, urgency and vomiting. Pertinent negatives include no flank pain, hematuria or hesitancy. Associated symptoms comments: + low back pain. She has tried home medications (azo) for the symptoms. The treatment provided no relief. There is no history of catheterization, kidney stones, recurrent UTIs, a single kidney or a urological procedure.            OB History    Gravida Para Term Preterm AB Living   1             SAB TAB Ectopic Multiple Live Births                      Past Medical History:  Diagnosis Date  . Alcohol dependence (HCC) 09/17/2012  . Anxiety   . Anxiety   . Asthma   . Depression   . Headache(784.0)   . Marijuana dependence (HCC) 09/17/2012  . Migraine   . Migraine, unspecified, without mention of intractable migraine without mention of status migrainosus 08/08/2012  . 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 Shingles 2012, May 2014   left arm         Past  Surgical History:  Procedure Laterality Date  . 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 Cancer Paternal Grandfather   . Other Neg Hx           Social History  Substance Use Topics  . Smoking status: Current Every Day Smoker    Packs/day: 1.00    Years: 3.00    Types: Cigarettes  . Smokeless tobacco: Never Used  . Alcohol use Yes     Allergies:       Allergies  Allergen Reactions  . Imitrex [Sumatriptan] Other (See Comments)    Increases migraine  . Penicillins Other (See Comments)    Unknown Childhood reaction Has tolerated Ceftriaxone in past           Prescriptions Prior to Admission  Medication Sig Dispense Refill Last Dose  . Multiple Vitamin (MULTIVITAMIN WITH MINERALS) TABS tablet Take 1 tablet by mouth daily.   Past Month at Unknown time  . Phenazopyridine HCl (AZO TABS PO) Take 2 tablets by mouth 2 (two) times daily as needed.   01/24/2016 at Unknown time  . Probiotic Product (PROBIOTIC PO) Take 2 each by mouth daily.   Past Month at Unknown time  . [DISCONTINUED] ibuprofen (ADVIL,MOTRIN) 200  MG tablet Take 400 mg by mouth every 6 (six) hours as needed for mild pain.   01/24/2016 at Unknown time  . albuterol (PROVENTIL HFA;VENTOLIN HFA) 108 (90 BASE) MCG/ACT inhaler Inhale 2 puffs into the lungs every 4 (four) hours as needed. For asthma   Rescue    Review of Systems  Constitutional: Positive for chills.       Didn't check temp at home  Gastrointestinal: Positive for abdominal pain, nausea and vomiting. Negative for constipation and diarrhea.  Genitourinary: Positive for dysuria, frequency and urgency. Negative for flank pain, hematuria and hesitancy.       No vaginal bleeding  Musculoskeletal: Positive for back pain and myalgias.   Physical Exam   Blood pressure (!) 100/43, pulse 108, temperature 101.2 F (38.4 C), temperature source Oral, resp. rate 18,  height 5\' 5"  (1.651 m), weight 207 lb 9.6 oz (94.2 kg), last menstrual period 12/16/2015, SpO2 99 %.  Temp:  [99 F (37.2 C)-101.2 F (38.4 C)] 101.2 F (38.4 C) (11/21 1914) Pulse Rate:  [108-121] 108 (11/21 1914) Resp:  [18] 18 (11/21 1914) BP: (100-112)/(43-74) 100/43 (11/21 1914) SpO2:  [99 %] 99 % (11/21 1657) Weight:  [207 lb 9.6 oz (94.2 kg)] 207 lb 9.6 oz (94.2 kg) (11/21 1443)   Physical Exam  Nursing note and vitals reviewed. Constitutional: She is oriented to person, place, and time. She appears well-developed and well-nourished. She appears distressed.  HENT:  Head: Normocephalic and atraumatic.  Eyes: Conjunctivae are normal. Right eye exhibits no discharge. Left eye exhibits no discharge. No scleral icterus.  Neck: Normal range of motion.  Cardiovascular: Normal rate, regular rhythm and normal heart sounds.   No murmur heard. Respiratory: Effort normal and breath sounds normal. No respiratory distress. She has no wheezes.  GI: Soft. There is no tenderness. There is no CVA tenderness.  Neurological: She is alert and oriented to person, place, and time.  Skin: Skin is warm and dry. She is not diaphoretic.  Psychiatric: She has a normal mood and affect. Her behavior is normal. Judgment and thought content normal.    MAU Course  Procedures Lab Results Last 24 Hours       Results for orders placed or performed during the hospital encounter of 01/25/16 (from the past 24 hour(s))  Urinalysis, Routine w reflex microscopic ([facility])     Status: Abnormal   Collection Time: 01/25/16  2:51 PM  Result Value Ref Range   Color, Urine AMBER (A) YELLOW   APPearance CLOUDY (A) CLEAR   Specific Gravity, Urine >1.030 (H) 1.005 - 1.030   pH 6.0 5.0 - 8.0   Glucose, UA NEGATIVE NEGATIVE mg/dL   Hgb urine dipstick MODERATE (A) NEGATIVE   Bilirubin Urine SMALL (A) NEGATIVE   Ketones, ur 15 (A) NEGATIVE mg/dL   Protein, ur 161100 (A) NEGATIVE mg/dL   Nitrite  POSITIVE (A) NEGATIVE   Leukocytes, UA MODERATE (A) NEGATIVE  Urine microscopic-add on     Status: Abnormal   Collection Time: 01/25/16  2:51 PM  Result Value Ref Range   Squamous Epithelial / LPF 6-30 (A) NONE SEEN   WBC, UA TOO NUMEROUS TO COUNT 0 - 5 WBC/hpf   RBC / HPF 0-5 0 - 5 RBC/hpf   Bacteria, UA MANY (A) NONE SEEN  Pregnancy, urine POC     Status: Abnormal   Collection Time: 01/25/16  2:58 PM  Result Value Ref Range   Preg Test, Ur POSITIVE (A) NEGATIVE  Wet  prep, genital     Status: Abnormal   Collection Time: 01/25/16  3:49 PM  Result Value Ref Range   Yeast Wet Prep HPF POC NONE SEEN NONE SEEN   Trich, Wet Prep NONE SEEN NONE SEEN   Clue Cells Wet Prep HPF POC PRESENT (A) NONE SEEN   WBC, Wet Prep HPF POC MODERATE (A) NONE SEEN   Sperm NONE SEEN   ABO/Rh     Status: None (Preliminary result)   Collection Time: 01/25/16  3:54 PM  Result Value Ref Range   ABO/RH(D) O POS   CBC     Status: Abnormal   Collection Time: 01/25/16  3:55 PM  Result Value Ref Range   WBC 11.6 (H) 4.0 - 10.5 K/uL   RBC 4.18 3.87 - 5.11 MIL/uL   Hemoglobin 11.7 (L) 12.0 - 15.0 g/dL   HCT 16.134.3 (L) 09.636.0 - 04.546.0 %   MCV 82.1 78.0 - 100.0 fL   MCH 28.0 26.0 - 34.0 pg   MCHC 34.1 30.0 - 36.0 g/dL   RDW 40.913.4 81.111.5 - 91.415.5 %   Platelets 317 150 - 400 K/uL  hCG, quantitative, pregnancy     Status: Abnormal   Collection Time: 01/25/16  3:55 PM  Result Value Ref Range   hCG, Beta Chain, Quant, S 19,650 (H) <5 mIU/mL      MDM +UPT UA, wet prep, GC/chlamydia, CBC, ABO/Rh, quant hCG, HIV, and US today to rule out ectopic pregnancy Ultrasound shows SIUP with cardiac activity IV ceftriaxone 2 gm for suspected pyelo Phenergan 25 mg IV Tylenol 1 gm PO Pt able to tolerate POs, no leukocytosis, no flank pain, afebrile, & no CVA tenderness. However, temp increased to 101.2 at 1914-- D/w Dr. Macon LargeAnyanwu who [facility].    Assessment  and Plan  A: 1. Pyelonephritis affecting pregnancy in first trimester   2. Abdominal pain affecting pregnancy   3. Nausea and vomiting during pregnancy   4. Normal IUP (intrauterine pregnancy) on prenatal ultrasound, first trimester    P: Admit for IV abx: Rocephin  Antiemetics and analgesia prn Routine floor care  Judeth HornErin Lawrence, NP 01/25/2016, 8:01 PM     Attestation of Attending Supervision of Advanced Practice Provider (PA/CNM/NP): Evaluation and management procedures were performed by the Advanced Practice Provider under my supervision and collaboration.  I have reviewed the Advanced Practice Provider's note and chart, and I agree with the management and plan.  Renal ultrasound also ordered given lower back pain.  Jaynie CollinsUGONNA  Apolonio Cutting, MD, FACOG Attending Obstetrician & Gynecologist Faculty Practice, [facility] [place]

## 2016-01-25 NOTE — Discharge Instructions (Signed)
Pyelonephritis, Adult °Introduction °Pyelonephritis is a kidney infection. The kidneys are the organs that filter a person's blood and move waste out of the bloodstream and into the urine. Urine passes from the kidneys, through the ureters, and into the bladder. There are two main types of pyelonephritis: °· Infections that come on quickly without any warning (acute pyelonephritis). °· Infections that last for a long period of time (chronic pyelonephritis). °In most cases, the infection clears up with treatment and does not cause further problems. More severe infections or chronic infections can sometimes spread to the bloodstream or lead to other problems with the kidneys. °What are the causes? °This condition is usually caused by: °· Bacteria traveling from the bladder to the kidney through infected urine. The urine in the bladder can become infected with bacteria from: °¨ Bladder infection (cystitis). °¨ Inflammation of the prostate gland (prostatitis). °¨ Sexual intercourse, in females. °· Bacteria traveling from the bloodstream to the kidney. °What increases the risk? °This condition is more likely to develop in: °· Pregnant women. °· Older people. °· People who have diabetes. °· People who have kidney stones or bladder stones. °· People who have other abnormalities of the kidney or ureter. °· People who have a catheter placed in the bladder. °· People who have cancer. °· People who are sexually active. °· Women who use spermicides. °· People who have had a prior urinary tract infection. °What are the signs or symptoms? °Symptoms of this condition include: °· Frequent urination. °· Strong or persistent urge to urinate. °· Burning or stinging when urinating. °· Abdominal pain. °· Back pain. °· Pain in the side or flank area. °· Fever. °· Chills. °· Blood in the urine, or dark urine. °· Nausea. °· Vomiting. °How is this diagnosed? °This condition may be diagnosed based on: °· Medical history and physical  exam. °· Urine tests. °· Blood tests. °You may also have imaging tests of the kidneys, such as an ultrasound or CT scan. °How is this treated? °Treatment for this condition may depend on the severity of the infection. °· If the infection is mild and is found early, you may be treated with antibiotic medicines taken by mouth. You will need to drink fluids to remain hydrated. °· If the infection is more severe, you may need to stay in the hospital and receive antibiotics given directly into a vein through an IV tube. You may also need to receive fluids through an IV tube if you are not able to remain hydrated. After your hospital stay, you may need to take oral antibiotics for a period of time. °Other treatments may be required, depending on the cause of the infection. °Follow these instructions at home: °Medicines °· Take over-the-counter and prescription medicines only as told by your health care provider. °· If you were prescribed an antibiotic medicine, take it as told by your health care provider. Do not stop taking the antibiotic even if you start to feel better. °General instructions °· Drink enough fluid to keep your urine clear or pale yellow. °· Avoid caffeine, tea, and carbonated beverages. They tend to irritate the bladder. °· Urinate often. Avoid holding in urine for long periods of time. °· Urinate before and after sex. °· After a bowel movement, women should cleanse from front to back. Use each tissue only once. °· Keep all follow-up visits as told by your health care provider. This is important. °Contact a health care provider if: °· Your symptoms do not get better after   2 days of treatment.  Your symptoms get worse.  You have a fever. Get help right away if:  You are unable to take your antibiotics or fluids.  You have shaking chills.  You vomit.  You have severe flank or back pain.  You have extreme weakness or fainting. This information is not intended to replace advice given to you  by your health care provider. Make sure you discuss any questions you have with your health care provider. Document Released: 02/20/2005 Document Revised: 07/29/2015 Document Reviewed: 06/15/2014  2017 Elsevier Pregnancy and Urinary Tract Infection WHAT IS A URINARY TRACT INFECTION? A urinary tract infection (UTI) is an infection of any part of the urinary tract. This includes the kidneys, the tubes that connect your kidneys to your bladder (ureters), the bladder, and the tube that carries urine out of your body (urethra). These organs make, store, and get rid of urine in the body. A UTI can be a bladder infection (cystitis) or a kidney infection (pyelonephritis). This infection may be caused by fungi, viruses, and bacteria. Bacteria are the most common cause of UTIs. You are more likely to develop a UTI during pregnancy because:  The physical and hormonal changes your body goes through can make it easier for bacteria to get into your urinary tract.  Your growing baby puts pressure on your uterus and can affect urine flow. DOES A UTI PLACE MY BABY AT RISK? An untreated UTI during pregnancy could lead to a kidney infection, which can cause health problems that could affect your baby. Possible complications of an untreated UTI include:  Having your baby before 37 weeks of pregnancy (premature).  Having a baby with a low birth weight.  Developing high blood pressure during pregnancy (preeclampsia). WHAT ARE THE SYMPTOMS OF A UTI? Symptoms of a UTI include:  Fever.  Frequent urination or passing small amounts of urine frequently.  Needing to urinate urgently.  Pain or a burning sensation with urination.  Urine that smells bad or unusual.  Cloudy urine.  Pain in the lower abdomen or back.  Trouble urinating.  Blood in the urine.  Vomiting or being less hungry than normal.  Diarrhea or abdominal pain.  Vaginal discharge. WHAT ARE THE TREATMENT OPTIONS FOR A UTI DURING  PREGNANCY? Treatment for this condition may include:  Antibiotic medicines that are safe to take during pregnancy.  Other medicines to treat less common causes of UTI. HOW CAN I PREVENT A UTI? To prevent a UTI:  Go to the bathroom as soon as you feel the need.  Always wipe from front to back.  Wash your genital area with soap and warm water daily.  Empty your bladder before and after sex.  Wear cotton underwear.  Limit your intake of high sugar foods or drinks, such as regular soda, juice, and sweets..  Drink 6-8 glasses of water daily.  Do not wear tight-fitting pants.  Do not douche or use deodorant sprays.  Do not drink alcohol, caffeine, or carbonated drinks. These can irritate the bladder. WHEN SHOULD I SEEK MEDICAL CARE? Seek medical care if:  Your symptoms do not improve or get worse.  You have a fever after two days of treatment.  You have a rash.  You have abnormal vaginal discharge.  You have back or side pain.  You have chills.  You have nausea and vomiting. WHEN SHOULD I SEEK IMMEDIATE MEDICAL CARE? Seek immediate medical care if you are pregnant and:  You feel contractions in your uterus.  You have lower belly pain.  You have a gush of fluid from your vagina.  You have blood in your urine.  You are vomiting and cannot keep down any medicines or water. This information is not intended to replace advice given to you by your health care provider. Make sure you discuss any questions you have with your health care provider. Document Released: 06/17/2010 Document Revised: 07/26/2015 Document Reviewed: 01/11/2015 Elsevier Interactive Patient Education  2017 [employer]

## 2016-01-26 ENCOUNTER — Encounter (HOSPITAL_COMMUNITY): Payer: Self-pay

## 2016-01-26 DIAGNOSIS — E876 Hypokalemia: Secondary | ICD-10-CM | POA: Diagnosis present

## 2016-01-26 LAB — CBC WITH DIFFERENTIAL/PLATELET
BASOS ABS: 0 10*3/uL (ref 0.0–0.1)
Basophils Relative: 0 %
EOS ABS: 0 10*3/uL (ref 0.0–0.7)
EOS PCT: 0 %
HCT: 32.9 % — ABNORMAL LOW (ref 36.0–46.0)
Hemoglobin: 11.5 g/dL — ABNORMAL LOW (ref 12.0–15.0)
LYMPHS PCT: 5 %
Lymphs Abs: 0.5 10*3/uL — ABNORMAL LOW (ref 0.7–4.0)
MCH: 28.3 pg (ref 26.0–34.0)
MCHC: 35 g/dL (ref 30.0–36.0)
MCV: 81 fL (ref 78.0–100.0)
MONO ABS: 0.9 10*3/uL (ref 0.1–1.0)
Monocytes Relative: 8 %
Neutro Abs: 9.3 10*3/uL — ABNORMAL HIGH (ref 1.7–7.7)
Neutrophils Relative %: 87 %
PLATELETS: 273 10*3/uL (ref 150–400)
RBC: 4.06 MIL/uL (ref 3.87–5.11)
RDW: 13.5 % (ref 11.5–15.5)
WBC: 10.7 10*3/uL — AB (ref 4.0–10.5)

## 2016-01-26 LAB — COMPREHENSIVE METABOLIC PANEL
ALT: 33 U/L (ref 14–54)
AST: 34 U/L (ref 15–41)
Albumin: 3.5 g/dL (ref 3.5–5.0)
Alkaline Phosphatase: 80 U/L (ref 38–126)
Anion gap: 7 (ref 5–15)
BUN: <5 mg/dL — ABNORMAL LOW (ref 6–20)
CHLORIDE: 107 mmol/L (ref 101–111)
CO2: 20 mmol/L — AB (ref 22–32)
Calcium: 8.3 mg/dL — ABNORMAL LOW (ref 8.9–10.3)
Creatinine, Ser: 0.7 mg/dL (ref 0.44–1.00)
Glucose, Bld: 158 mg/dL — ABNORMAL HIGH (ref 65–99)
POTASSIUM: 2.8 mmol/L — AB (ref 3.5–5.1)
SODIUM: 134 mmol/L — AB (ref 135–145)
Total Bilirubin: 0.4 mg/dL (ref 0.3–1.2)
Total Protein: 6.7 g/dL (ref 6.5–8.1)

## 2016-01-26 LAB — INFLUENZA PANEL BY PCR (TYPE A & B)
INFLAPCR: NEGATIVE
Influenza B By PCR: NEGATIVE

## 2016-01-26 LAB — GC/CHLAMYDIA PROBE AMP (~~LOC~~) NOT AT ARMC
CHLAMYDIA, DNA PROBE: NEGATIVE
NEISSERIA GONORRHEA: NEGATIVE

## 2016-01-26 LAB — HIV ANTIBODY (ROUTINE TESTING W REFLEX): HIV Screen 4th Generation wRfx: NONREACTIVE

## 2016-01-26 LAB — LACTIC ACID, PLASMA: LACTIC ACID, VENOUS: 2.2 mmol/L — AB (ref 0.5–1.9)

## 2016-01-26 MED ORDER — DEXTROSE 5 % IV SOLN
2.0000 g | INTRAVENOUS | Status: DC
  Administered 2016-01-26: 2 g via INTRAVENOUS
  Filled 2016-01-26: qty 2

## 2016-01-26 MED ORDER — POTASSIUM CHLORIDE CRYS ER 20 MEQ PO TBCR
40.0000 meq | EXTENDED_RELEASE_TABLET | Freq: Two times a day (BID) | ORAL | Status: AC
  Administered 2016-01-26 – 2016-01-28 (×5): 40 meq via ORAL
  Filled 2016-01-26 (×5): qty 2

## 2016-01-26 NOTE — Progress Notes (Signed)
[facility] Attending Note   Subjective:  Patient still reports having chills, Tmax 102.8 at 0040. Had blood cultures drawn, Flu swab checked. Still has midline lower back pain; had negative renal scan.  Also has nausea and vomiting. No vaginal bleeding or other OB concerns.   Admitted on 01/25/2016 for Pyelonephritis affecting pregnancy in first trimester.    Objective:  Blood pressure (!) 86/54, pulse 92, temperature 99.7 F (37.6 C), temperature source Oral, resp. rate 18, height 5' 5"  (1.651 m), weight 207 lb 9.6 oz (94.2 kg), last menstrual period 12/16/2015, SpO2 100 %.  Patient Vitals for the past 24 hrs:  BP Temp Temp src Pulse Resp SpO2 Height Weight  01/26/16 0615 - 99.7 F (37.6 C) Oral - - - - -  01/26/16 0359 (!) 86/54 100.1 F (37.8 C) Oral 92 18 100 % - -  01/26/16 0226 - 99.2 F (37.3 C) Oral - - - - -  01/26/16 0040 97/62 (!) 102.8 F (39.3 C) Oral (!) 122 (!) 22 99 % - -  01/25/16 2351 135/77 (!) 101 F (38.3 C) Oral (!) 120 18 100 % - -  01/25/16 2200 - - - - - 100 % - -  01/25/16 2035 102/66 99.3 F (37.4 C) Oral 94 16 100 % - -  01/25/16 1914 (!) 100/43 101.2 F (38.4 C) Oral 108 18 - - -  01/25/16 1657 - 99 F (37.2 C) Oral 118 18 99 % - -  01/25/16 1529 104/56 99.5 F (37.5 C) Oral 114 18 - - -  01/25/16 1443 112/74 99.1 F (37.3 C) - (!) 121 18 99 % 5' 5"  (1.651 m) 207 lb 9.6 oz (94.2 kg)    Gen: Shaking due to chills HENT: Normocephalic, atraumatic Lungs: Normal respiratory effort Heart: Regular rate noted Abdomen:+suprapubic tenderness,  Soft abdomen Back: No CVAT tenderness Cervix: Deferred Ext: 2+ DTRs, no edema, no cyanosis, negative Homan's sign   Results for orders placed or performed during the hospital encounter of 01/25/16 (from the past 48 hour(s))  Urinalysis, Routine w reflex microscopic (not at [facility])     Status: Abnormal   Collection Time: 01/25/16  2:51 PM  Result Value Ref Range   Color, Urine AMBER (A) YELLOW     Comment: BIOCHEMICALS MAY BE AFFECTED BY COLOR   APPearance CLOUDY (A) CLEAR   Specific Gravity, Urine >1.030 (H) 1.005 - 1.030   pH 6.0 5.0 - 8.0   Glucose, UA NEGATIVE NEGATIVE mg/dL   Hgb urine dipstick MODERATE (A) NEGATIVE   Bilirubin Urine SMALL (A) NEGATIVE   Ketones, ur 15 (A) NEGATIVE mg/dL   Protein, ur 100 (A) NEGATIVE mg/dL   Nitrite POSITIVE (A) NEGATIVE   Leukocytes, UA MODERATE (A) NEGATIVE  Urine microscopic-add on     Status: Abnormal   Collection Time: 01/25/16  2:51 PM  Result Value Ref Range   Squamous Epithelial / LPF 6-30 (A) NONE SEEN   WBC, UA TOO NUMEROUS TO COUNT 0 - 5 WBC/hpf   RBC / HPF 0-5 0 - 5 RBC/hpf   Bacteria, UA MANY (A) NONE SEEN  Pregnancy, urine POC     Status: Abnormal   Collection Time: 01/25/16  2:58 PM  Result Value Ref Range   Preg Test, Ur POSITIVE (A) NEGATIVE    Comment:        THE SENSITIVITY OF THIS METHODOLOGY IS >24 mIU/mL   Wet prep, genital     Status: Abnormal   Collection Time:  01/25/16  3:49 PM  Result Value Ref Range   Yeast Wet Prep HPF POC NONE SEEN NONE SEEN   Trich, Wet Prep NONE SEEN NONE SEEN   Clue Cells Wet Prep HPF POC PRESENT (A) NONE SEEN   WBC, Wet Prep HPF POC MODERATE (A) NONE SEEN    Comment: BACTERIA- TOO NUMEROUS TO COUNT   Sperm NONE SEEN   ABO/Rh     Status: None   Collection Time: 01/25/16  3:54 PM  Result Value Ref Range   ABO/RH(D) O POS   CBC     Status: Abnormal   Collection Time: 01/25/16  3:55 PM  Result Value Ref Range   WBC 11.6 (H) 4.0 - 10.5 K/uL   RBC 4.18 3.87 - 5.11 MIL/uL   Hemoglobin 11.7 (L) 12.0 - 15.0 g/dL   HCT 34.3 (L) 36.0 - 46.0 %   MCV 82.1 78.0 - 100.0 fL   MCH 28.0 26.0 - 34.0 pg   MCHC 34.1 30.0 - 36.0 g/dL   RDW 13.4 11.5 - 15.5 %   Platelets 317 150 - 400 K/uL  hCG, quantitative, pregnancy     Status: Abnormal   Collection Time: 01/25/16  3:55 PM  Result Value Ref Range   hCG, Beta Chain, Quant, S 19,650 (H) <5 mIU/mL    Comment:          GEST. AGE      CONC.   (mIU/mL)   <=1 WEEK        5 - 50     2 WEEKS       50 - 500     3 WEEKS       100 - 10,000     4 WEEKS     1,000 - 30,000     5 WEEKS     3,500 - 115,000   6-8 WEEKS     12,000 - 270,000    12 WEEKS     15,000 - 220,000        FEMALE AND NON-PREGNANT FEMALE:     LESS THAN 5 mIU/mL   HIV antibody     Status: None   Collection Time: 01/25/16  3:55 PM  Result Value Ref Range   HIV Screen 4th Generation wRfx Non Reactive Non Reactive    Comment: (NOTE) Performed At: [facility] [address] [place], [place] [id number] Lindon Romp MD [id number]   CBC with Differential/Platelet     Status: Abnormal   Collection Time: 01/26/16  1:36 AM  Result Value Ref Range   WBC 10.7 (H) 4.0 - 10.5 K/uL   RBC 4.06 3.87 - 5.11 MIL/uL   Hemoglobin 11.5 (L) 12.0 - 15.0 g/dL   HCT 32.9 (L) 36.0 - 46.0 %   MCV 81.0 78.0 - 100.0 fL   MCH 28.3 26.0 - 34.0 pg   MCHC 35.0 30.0 - 36.0 g/dL   RDW 13.5 11.5 - 15.5 %   Platelets 273 150 - 400 K/uL   Neutrophils Relative % 87 %   Neutro Abs 9.3 (H) 1.7 - 7.7 K/uL   Lymphocytes Relative 5 %   Lymphs Abs 0.5 (L) 0.7 - 4.0 K/uL   Monocytes Relative 8 %   Monocytes Absolute 0.9 0.1 - 1.0 K/uL   Eosinophils Relative 0 %   Eosinophils Absolute 0.0 0.0 - 0.7 K/uL   Basophils Relative 0 %   Basophils Absolute 0.0 0.0 - 0.1 K/uL  Comprehensive metabolic panel  Status: Abnormal   Collection Time: 01/26/16  1:36 AM  Result Value Ref Range   Sodium 134 (L) 135 - 145 mmol/L   Potassium 2.8 (L) 3.5 - 5.1 mmol/L   Chloride 107 101 - 111 mmol/L   CO2 20 (L) 22 - 32 mmol/L   Glucose, Bld 158 (H) 65 - 99 mg/dL   BUN <5 (L) 6 - 20 mg/dL    Comment: REPEATED TO VERIFY   Creatinine, Ser 0.70 0.44 - 1.00 mg/dL   Calcium 8.3 (L) 8.9 - 10.3 mg/dL   Total Protein 6.7 6.5 - 8.1 g/dL   Albumin 3.5 3.5 - 5.0 g/dL   AST 34 15 - 41 U/L   ALT 33 14 - 54 U/L   Alkaline Phosphatase 80 38 - 126 U/L   Total Bilirubin 0.4 0.3 - 1.2 mg/dL   GFR calc non  Af Amer >60 >60 mL/min   GFR calc Af Amer >60 >60 mL/min    Comment: (NOTE) The eGFR has been calculated using the CKD EPI equation. This calculation has not been validated in all clinical situations. eGFR's persistently <60 mL/min signify possible Chronic Kidney Disease.    Anion gap 7 5 - 15  Lactic acid, plasma     Status: Abnormal   Collection Time: 01/26/16  1:36 AM  Result Value Ref Range   Lactic Acid, Venous 2.2 (HH) 0.5 - 1.9 mmol/L    Comment: CRITICAL RESULT CALLED TO, READ BACK BY AND VERIFIED WITH: Gunnar Bulla RN [facility]) 01/26/2016 0325 JORDANS Performed at [facility]     US Ob Comp Less 14 Wks  Result Date: 01/25/2016 CLINICAL DATA:  Left lower quadrant pain with chills and sweats EXAM: OBSTETRIC <14 WK [place] AND TRANSVAGINAL OB US TECHNIQUE: Both transabdominal and transvaginal ultrasound examinations were performed for complete evaluation of the gestation as well as the maternal uterus, adnexal regions, and pelvic cul-de-sac. Transvaginal technique was performed to assess early pregnancy. COMPARISON:  None. FINDINGS: Intrauterine gestational sac: Single intrauterine gestational sac identified. Yolk sac:  Visualized Embryo:  Visualized Cardiac Activity: Visualized Heart Rate: 111  bpm CRL:  2.5  mm   5 w   5 d                  [place] EDC: 09/21/2016 Subchorionic hemorrhage:  None visualized. Maternal uterus/adnexae: Uterus is grossly unremarkable. The bilateral ovaries are within normal limits. The left ovary measures 2.6 x 1.8 x 2.7 cm. The right ovary measures 1.7 x 1.2 x 1.2 cm. No free fluid. IMPRESSION: Single intrauterine gestation with fetal cardiac activity as above. Otherwise negative pelvic ultrasound. Electronically Signed   By: Donavan Foil M.D.   On: 01/25/2016 18:00   US Ob Transvaginal  Result Date: 01/25/2016 CLINICAL DATA:  Left lower quadrant pain with chills and sweats EXAM: OBSTETRIC <14 WK [place] AND TRANSVAGINAL OB US TECHNIQUE: Both transabdominal and  transvaginal ultrasound examinations were performed for complete evaluation of the gestation as well as the maternal uterus, adnexal regions, and pelvic cul-de-sac. Transvaginal technique was performed to assess early pregnancy. COMPARISON:  None. FINDINGS: Intrauterine gestational sac: Single intrauterine gestational sac identified. Yolk sac:  Visualized Embryo:  Visualized Cardiac Activity: Visualized Heart Rate: 111  bpm CRL:  2.5  mm   5 w   5 d                  [place] EDC: 09/21/2016 Subchorionic hemorrhage:  None visualized. Maternal uterus/adnexae: Uterus is grossly unremarkable. The  bilateral ovaries are within normal limits. The left ovary measures 2.6 x 1.8 x 2.7 cm. The right ovary measures 1.7 x 1.2 x 1.2 cm. No free fluid. IMPRESSION: Single intrauterine gestation with fetal cardiac activity as above. Otherwise negative pelvic ultrasound. Electronically Signed   By: Donavan Foil M.D.   On: 01/25/2016 18:00   US Renal  Result Date: 01/25/2016 CLINICAL DATA:  Low back pain, [redacted] weeks pregnant, left lower quadrant pain EXAM: RENAL / URINARY TRACT ULTRASOUND COMPLETE COMPARISON:  None. FINDINGS: Right Kidney: Length: 9.8 cm. Echogenicity within normal limits. No mass or hydronephrosis visualized. Left Kidney: Length: 10.2 cm. Echogenicity within normal limits. No mass or hydronephrosis visualized. Bladder: Urinary bladder is empty. IMPRESSION: 1. No hydronephrosis or renal calculi.  Empty urinary bladder. Electronically Signed   By: Lahoma Crocker M.D.   On: 01/25/2016 20:46     Assessment & Plan:  30 y.o. G1P0 at 84w6dadmitted for pyelonephritis in pregnancy. Does not currently meet criteria for sepsis but will continue close observation. Continue Rocephin, antiemetics, analgesic medications as needed.  Will follow up pending cultures and Flu swab.   Continue close observation.  UVerita Schneiders MD, FStrathmereAttending OPalm Beach Gardens [facility]

## 2016-01-26 NOTE — Progress Notes (Signed)
Inpatient Diabetes Program Recommendations  Diabetes Treatment Program Recommendations  ADA Standards of Care 2016 Diabetes in Pregnancy Target Glucose Ranges:  Fasting: 60 - 90 mg/dL Preprandial: 60 - 409105 mg/dL 1 hr postprandial: Less than 140mg /dL (from first bite of meal) 2 hr postprandial: Less than 120 mg/dL (from first bite of meal)    Results for Evelyn DeedsHILPOT, [name] (MRN [id number]) as of 01/26/2016 07:22  Ref. Range 01/26/2016 01:36  Glucose Latest Ref Range: 65 - 99 mg/dL 956158 (H)    Review of Glycemic Control  Diabetes history: No Outpatient Diabetes medications: NA Current orders for Inpatient glycemic control: None  Inpatient Diabetes Program Recommendations: HgbA1C: May want to consider ordering an A1C to evaluate glycemic control over the past 2-3 months. CBG monitoring: Please consider ordering CBGs at least fasting and if elevated then 2 hour post prandial. If glucose is elevated above targets, please consider ordering Novolog correction scale while inpatient.  Thanks, Orlando PennerMarie Kiarah Eckstein, RN, MSN, CDE Diabetes Coordinator Inpatient Diabetes Program [phone] (Team Pager from 8am to 5pm)

## 2016-01-27 ENCOUNTER — Inpatient Hospital Stay (HOSPITAL_COMMUNITY): Payer: Self-pay

## 2016-01-27 LAB — CULTURE, OB URINE

## 2016-01-27 LAB — CBC WITH DIFFERENTIAL/PLATELET
BASOS PCT: 0 %
Basophils Absolute: 0 10*3/uL (ref 0.0–0.1)
EOS ABS: 0 10*3/uL (ref 0.0–0.7)
Eosinophils Relative: 1 %
HCT: 30.6 % — ABNORMAL LOW (ref 36.0–46.0)
HEMOGLOBIN: 10.5 g/dL — AB (ref 12.0–15.0)
LYMPHS ABS: 1.3 10*3/uL (ref 0.7–4.0)
LYMPHS PCT: 20 %
MCH: 28.1 pg (ref 26.0–34.0)
MCHC: 34.3 g/dL (ref 30.0–36.0)
MCV: 81.8 fL (ref 78.0–100.0)
MONOS PCT: 7 %
Monocytes Absolute: 0.5 10*3/uL (ref 0.1–1.0)
NEUTROS ABS: 4.8 10*3/uL (ref 1.7–7.7)
Neutrophils Relative %: 72 %
Platelets: 256 10*3/uL (ref 150–400)
RBC: 3.74 MIL/uL — AB (ref 3.87–5.11)
RDW: 13.4 % (ref 11.5–15.5)
WBC: 6.7 10*3/uL (ref 4.0–10.5)

## 2016-01-27 LAB — BASIC METABOLIC PANEL
Anion gap: 5 (ref 5–15)
BUN: <5 mg/dL — ABNORMAL LOW (ref 6–20)
CHLORIDE: 107 mmol/L (ref 101–111)
CO2: 24 mmol/L (ref 22–32)
CREATININE: 0.69 mg/dL (ref 0.44–1.00)
Calcium: 8.1 mg/dL — ABNORMAL LOW (ref 8.9–10.3)
Glucose, Bld: 98 mg/dL (ref 65–99)
POTASSIUM: 3.3 mmol/L — AB (ref 3.5–5.1)
SODIUM: 136 mmol/L (ref 135–145)

## 2016-01-27 MED ORDER — DEXTROSE 5 % IV SOLN
2.0000 g | INTRAVENOUS | Status: DC
  Administered 2016-01-27: 2 g via INTRAVENOUS
  Filled 2016-01-27 (×2): qty 2

## 2016-01-27 MED ORDER — ENSURE ENLIVE PO LIQD
237.0000 mL | Freq: Two times a day (BID) | ORAL | Status: DC
  Administered 2016-01-27 – 2016-01-28 (×3): 237 mL via ORAL
  Filled 2016-01-27 (×6): qty 237

## 2016-01-27 MED ORDER — ONDANSETRON 4 MG PO TBDP
4.0000 mg | ORAL_TABLET | Freq: Four times a day (QID) | ORAL | Status: DC | PRN
  Filled 2016-01-27: qty 1

## 2016-01-27 MED ORDER — PYRIDOXINE HCL 25 MG PO TABS
25.0000 mg | ORAL_TABLET | Freq: Three times a day (TID) | ORAL | Status: DC
  Administered 2016-01-27 – 2016-01-29 (×7): 25 mg via ORAL
  Filled 2016-01-27 (×8): qty 1

## 2016-01-27 MED ORDER — DIPHENHYDRAMINE HCL 25 MG PO CAPS
25.0000 mg | ORAL_CAPSULE | Freq: Three times a day (TID) | ORAL | Status: DC
  Administered 2016-01-27 (×3): 25 mg via ORAL
  Filled 2016-01-27 (×3): qty 1

## 2016-01-27 MED ORDER — SODIUM CHLORIDE 0.9 % IV SOLN
1.0000 g | Freq: Three times a day (TID) | INTRAVENOUS | Status: DC
  Administered 2016-01-27 (×2): 1 g via INTRAVENOUS
  Filled 2016-01-27 (×4): qty 1

## 2016-01-27 MED ORDER — PANTOPRAZOLE SODIUM 40 MG IV SOLR
40.0000 mg | Freq: Every day | INTRAVENOUS | Status: DC
  Administered 2016-01-27: 40 mg via INTRAVENOUS
  Filled 2016-01-27 (×2): qty 40

## 2016-01-27 NOTE — Progress Notes (Signed)
ANTIBIOTIC CONSULT NOTE - INITIAL  Pharmacy Consult for meropenem Indication: pyelonephritis  Allergies  Allergen Reactions  . Imitrex [Sumatriptan] Other (See Comments)    Increases migraine  . Penicillins Other (See Comments)    Unknown Childhood reaction Tolerates Ceftriaxone    Patient Measurements: Height: 5\' 5"  (165.1 cm) Weight: 207 lb 9.6 oz (94.2 kg) IBW/kg (Calculated) : 57 Adjusted Body Weight:   Vital Signs: Temp: 102.3 F (39.1 C) (11/22 2345) Temp Source: Oral (11/22 2345) BP: 106/62 (11/22 2345) Pulse Rate: 111 (11/22 2345)  Labs:  Recent Labs  01/25/16 1555 01/26/16 0136  WBC 11.6* 10.7*  HGB 11.7* 11.5*  PLT 317 273  CREATININE  --  0.70    Microbiology: Recent Results (from the past 720 hour(s))  Culture, OB Urine     Status: Abnormal (Preliminary result)   Collection Time: 01/25/16  2:50 PM  Result Value Ref Range Status   Specimen Description OB CLEAN CATCH  Final   Special Requests NONE  Final   Culture >=100,000 COLONIES/mL GRAM NEGATIVE RODS (A)  Final   Report Status PENDING  Incomplete  Wet prep, genital     Status: Abnormal   Collection Time: 01/25/16  3:49 PM  Result Value Ref Range Status   Yeast Wet Prep HPF POC NONE SEEN NONE SEEN Final   Trich, Wet Prep NONE SEEN NONE SEEN Final   Clue Cells Wet Prep HPF POC PRESENT (A) NONE SEEN Final   WBC, Wet Prep HPF POC MODERATE (A) NONE SEEN Final    Comment: BACTERIA- TOO NUMEROUS TO COUNT   Sperm NONE SEEN  Final    Medications:  Ceftriaxone 2g Q24 11/22>>11/22 (1 dose given)  Assessment: 30 y.o. female G1P0 at 3839w0d with fever, chills, dysuria and abdominal pain, lower back pain. Received 1 dose ceftriaxone yesterday and switching to meropenem today 2/2 recurrent elevated temps and need to cover ESBL.    Plan:  Meropenem 1 gram Q 8 hours.  Will follow clinically.   Thank you.   Jaslen Adcox Scarlett 01/27/2016,12:54 AM

## 2016-01-27 NOTE — Progress Notes (Addendum)
Daily Antepartum Note  Admission Date: 01/25/2016 Current Date: 01/27/2016 7:38 AM  Evelyn Fields is a 30 y.o. G1 @ 935w0d by u/s, HD#3, admitted for pyelonephritis.  Pregnancy complicated by: Patient Active Problem List   Diagnosis Date Noted  . Hypokalemia 01/26/2016  . Pyelonephritis affecting pregnancy in first trimester 01/25/2016    Overnight/24hr events:  Temp to 39.1 at 2345 on 11/22  Subjective:  Occasional chills but doesn't feel feverish; occasional cramps but no VB, discharge. +nausea and occasional vomiting. Taking some PO liquids  Objective:    Current Vital Signs 24h Vital Sign Ranges  T 98.3 F (36.8 C) Temp  Avg: 99.6 F (37.6 C)  Min: 98.2 F (36.8 C)  Max: 102.3 F (39.1 C)  BP 112/68 BP  Min: 102/66  Max: 124/71  HR 86 Pulse  Avg: 103.4  Min: 86  Max: 111  RR 16 Resp  Avg: 16.9  Min: 16  Max: 18  SaO2 100 % Not Delivered SpO2  Avg: 98.2 %  Min: 95 %  Max: 100 %       24 Hour I/O Current Shift I/O  Time Ins Outs 11/22 0701 - 11/23 0700 In: 2914.5 [P.O.:1260; I.V.:1554.5] Out: 2750 [Urine:2550] No intake/output data recorded.   Patient Vitals for the past 24 hrs:  BP Temp Temp src Pulse Resp SpO2  01/27/16 0402 112/68 98.3 F (36.8 C) Oral 86 16 100 %  01/27/16 0100 (!) 110/58 99.8 F (37.7 C) Oral (!) 108 16 -  01/26/16 2345 106/62 (!) 102.3 F (39.1 C) Oral (!) 111 18 -  01/26/16 1954 124/71 98.2 F (36.8 C) Oral (!) 111 16 98 %  01/26/16 1804 - 98.6 F (37 C) Oral - - -  01/26/16 1538 102/66 (!) 101.4 F (38.6 C) Oral (!) 111 16 95 %  01/26/16 1110 123/67 98.6 F (37 C) Oral 95 18 100 %  01/26/16 0806 (!) 111/57 99.9 F (37.7 C) Oral (!) 102 18 98 %    Physical exam: General: Well nourished, well developed female in no acute distress. Abdomen: soft, nttp Back: no CVAT b/l Cardiovascular: S1, S2 normal, no murmur, rub or gallop, regular rate and rhythm Respiratory: CTAB Extremities: no clubbing, cyanosis or edema Skin: Warm and dry.    Medications: Current Facility-Administered Medications  Medication Dose Route Frequency Provider Last Rate Last Dose  . 0.9 %  sodium chloride infusion   Intravenous Continuous Evelyn Fermina Bingharlie Alister Staver, MD 75 mL/hr at 01/26/16 2052    . acetaminophen (TYLENOL) tablet 1,000 mg  1,000 mg Oral Q6H PRN Tereso NewcomerUgonna A Anyanwu, MD   1,000 mg at 01/26/16 2355  . albuterol (PROVENTIL) (2.5 MG/3ML) 0.083% nebulizer solution 3 mL  3 mL Inhalation Q4H PRN Tereso NewcomerUgonna A Anyanwu, MD      . calcium carbonate (TUMS - dosed in mg elemental calcium) chewable tablet 400 mg of elemental calcium  2 tablet Oral Q4H PRN Tereso NewcomerUgonna A Anyanwu, MD   400 mg of elemental calcium at 01/27/16 0623  . docusate sodium (COLACE) capsule 100 mg  100 mg Oral Daily Evelyn A Anyanwu, MD      . meropenem (MERREM) 1 g in sodium chloride 0.9 % 100 mL IVPB  1 g Intravenous Q8H Evelyn A Anyanwu, MD   1 g at 01/27/16 0056  . metoCLOPramide (REGLAN) injection 10 mg  10 mg Intravenous Q6H PRN Tereso NewcomerUgonna A Anyanwu, MD   10 mg at 01/27/16 0620  . oxyCODONE (Oxy IR/ROXICODONE) immediate release tablet 5-10 mg  5-10 mg Oral Q4H PRN Tereso NewcomerUgonna A Anyanwu, MD   5 mg at 01/26/16 2007  . phenazopyridine (PYRIDIUM) tablet 200 mg  200 mg Oral TID WC Evelyn A Anyanwu, MD   200 mg at 01/26/16 1728  . potassium chloride SA (K-DUR,KLOR-CON) CR tablet 40 mEq  40 mEq Oral BID Evelyn Bingharlie Klayton Monie, MD   40 mEq at 01/26/16 2048  . prenatal multivitamin tablet 1 tablet  1 tablet Oral Q1200 Evelyn A Anyanwu, MD      . promethazine (PHENERGAN) injection 25 mg  25 mg Intravenous Q6H PRN Tereso NewcomerUgonna A Anyanwu, MD   25 mg at 01/27/16 0218  . zolpidem (AMBIEN) tablet 5 mg  5 mg Oral QHS PRN Tereso NewcomerUgonna A Anyanwu, MD   5 mg at 01/26/16 2242    Labs:   Recent Labs Lab 01/25/16 1555 01/26/16 0136 01/27/16 0553  WBC 11.6* 10.7* 6.7  HGB 11.7* 11.5* 10.5*  HCT 34.3* 32.9* 30.6*  PLT 317 273 256     Recent Labs Lab 01/26/16 0136 01/27/16 0553  NA 134* 136  K 2.8* 3.3*  CL 107 107  CO2 20* 24   BUN <5* <5*  CREATININE 0.70 0.69  CALCIUM 8.3* 8.1*  PROT 6.7  --   BILITOT 0.4  --   ALKPHOS 80  --   ALT 33  --   AST 34  --   GLUCOSE 158* 98    Pending: UCx >100k GNR with sx pending. BCX x 2 from 11/22 @ 0130  Negative: flu swab  Radiology: no new imaging  Assessment & Plan:  Pt stable *Pregnancy:routine care. Viable IUP on admit u/s. PNV qday *Pyelo: with UCx final results still pending, concern for ESBL given recurrent temp spikes so rocephin d/c'ed and changed to meropenem and ordered another renal u/s which pt is to get this AM.  *PPx: SCDs, OOB ad lib *FEN/GI: will do clear liquid diet and vit b6 and benadryl added to regimen along with ensure. K supplemented and rising appropriately.  *Dispo: pending resolution of fever on PO abx; likely another 3-4d  Evelyn Fields, Jr. MD Attending Center for [facility](Faculty Practice)

## 2016-01-27 NOTE — Progress Notes (Signed)
Patient requested to take a little break and stroll outside, in front of the hospital for fresh air. Night supervisor made aware. Patient unhooked from Iv fluids and accompanied her friend to walk outside for fresh air.Marland Kitchen..Marland Kitchen

## 2016-01-27 NOTE — Progress Notes (Signed)
Initial visit with Evelyn Fields to introduce spiritual care services and offer support during her hospitalization.  Evelyn Fields shared that the last few days have been really hard.  She is grateful she stopped at the hospital to get checked out before driving to [place], but really struggling with her illness.  She shared that she's never been hospitalized before and feels frustrated that it's taking so long to get a handle on the infection.  She is grateful for the support of family and friends, but still feeling isolated and frightened.  She tries to avoid looking up too much on the internet and struggles with staying focused on the present.  She shared that she knows she should be grateful, but she doesn't feel that way.  I encouraged her to be kind to herself and know that she can be grateful for her life and angry about being stuck in the hospital on Thanksgiving at the same time.  We discussed ways to cope with feeling stuck and out of control and I shared some mindfulness practices with her, for which she was grateful.  I also brought her some coloring pages and colored pencils to help her pass the time and redirect her mental energy.  Our team will continue to follow, but please page as further needs arise.  Maryanna ShapeAmanda M. Carley Hammedavee Lomax, M.Div. [facility] Chaplain Pager [phone] Office [phone]      01/27/16 1000  Clinical Encounter Type  Visited With Patient  Visit Type Spiritual support;Social support  Referral From Nurse  Consult/Referral To Chaplain  Stress Factors  Patient Stress Factors Health changes

## 2016-01-28 MED ORDER — CEPHALEXIN 500 MG PO CAPS
500.0000 mg | ORAL_CAPSULE | Freq: Four times a day (QID) | ORAL | Status: DC
  Administered 2016-01-28 – 2016-01-29 (×3): 500 mg via ORAL
  Filled 2016-01-28 (×4): qty 1

## 2016-01-28 MED ORDER — DIPHENHYDRAMINE HCL 25 MG PO CAPS: 25.0000 mg | ORAL_CAPSULE | Freq: Three times a day (TID) | ORAL | Status: DC | PRN

## 2016-01-28 MED ORDER — PROMETHAZINE HCL 25 MG PO TABS
25.0000 mg | ORAL_TABLET | Freq: Four times a day (QID) | ORAL | Status: DC | PRN
  Administered 2016-01-28 – 2016-01-29 (×3): 25 mg via ORAL
  Filled 2016-01-28 (×3): qty 1

## 2016-01-28 MED ORDER — CEPHALEXIN 500 MG PO CAPS
500.0000 mg | ORAL_CAPSULE | Freq: Four times a day (QID) | ORAL | Status: DC
  Filled 2016-01-28: qty 1

## 2016-01-28 MED ORDER — PANTOPRAZOLE SODIUM 40 MG PO TBEC
40.0000 mg | DELAYED_RELEASE_TABLET | ORAL | Status: DC
  Administered 2016-01-28: 40 mg via ORAL
  Filled 2016-01-28 (×2): qty 1

## 2016-01-28 NOTE — Progress Notes (Signed)
Patient ID: [name], female   DOB: 05/06/1985, 30 y.o.   MRN: [id number]   3 days  Pyelonephritis affecting pregnancy in first trimester - Plan: US Renal, US Renal  Abdominal pain affecting pregnancy  Nausea and vomiting during pregnancy - Plan: Discharge patient  Normal IUP (intrauterine pregnancy) on prenatal ultrasound, first trimester  Pyelonephritis affecting pregnancy - Plan: US Renal, US Renal   Pt feels much better No fever in 24 hours Prelim blood culture is negative Exam no CVAT  Switch to oral keflex(IV came out)  Will give another dose of rocephin prior to discharge Will await final blood culture prior to discharge

## 2016-01-28 NOTE — Progress Notes (Signed)
Patient requested her iv to be removed because she was hurting from the site. Iv d/ced and patient instructed to drink more fluids.

## 2016-01-28 NOTE — Progress Notes (Signed)
I received a referral from Chaplain Evelyn Fields who spent time with pt yesterday.  Evelyn Fields reports that she is doing much better physically and coping much better today as well now that it is not the holiday.  She had several friends in the room and so I did not ask her anything about how she is coping with the pregnancy.  She is aware of our ongoing availability and should she wish to talk further, she knows she can alert her RN and they will be in touch with us.  Chaplain Evelyn Fields, B[facility] Pager, [phone] 11:14 AM    01/28/16 1100  Clinical Encounter Type  Visited With Patient  Visit Type Follow-up  Referral From Chaplain

## 2016-01-29 MED ORDER — CEPHALEXIN 500 MG PO CAPS: 500.0000 mg | ORAL_CAPSULE | Freq: Four times a day (QID) | ORAL | 0 refills | Status: DC

## 2016-01-29 MED ORDER — CEFTRIAXONE SODIUM 1 G IJ SOLR
1.0000 g | Freq: Once | INTRAMUSCULAR | Status: AC
  Administered 2016-01-29: 1 g via INTRAMUSCULAR
  Filled 2016-01-29: qty 10

## 2016-01-29 MED ORDER — PROMETHAZINE HCL 25 MG PO TABS: 25.0000 mg | ORAL_TABLET | Freq: Four times a day (QID) | ORAL | 0 refills | Status: DC | PRN

## 2016-01-29 MED ORDER — ALBUTEROL SULFATE (2.5 MG/3ML) 0.083% IN NEBU: 3.0000 mL | INHALATION_SOLUTION | RESPIRATORY_TRACT | 12 refills | Status: DC | PRN

## 2016-01-29 NOTE — Progress Notes (Signed)

## 2016-01-29 NOTE — Discharge Summary (Signed)
Physician Discharge Summary  Patient ID: Benancio DeedsSasha Ewing MRN: [id number] DOB/AGE: 29/04/1985 29 y.o.  Admit date: 01/25/2016 Discharge date: 01/29/2016  Admission Diagnoses:early pregnancy with pyelonephritis  Discharge Diagnoses:  Principal Problem:   Pyelonephritis affecting pregnancy in first trimester Active Problems:   Hypokalemia   Discharged Condition: good  Hospital Course: presented with high fever leukocytosis and flank pain with a urine consistent with infection  Consults: None  Significant Diagnostic Studies: labs: urine culture  Treatments: antibiotics: Ancef  Discharge Exam: Blood pressure 113/77, pulse 87, temperature 98.8 F (37.1 C), temperature source Oral, resp. rate 18, height 5\' 5"  (1.651 m), weight 207 lb 9.6 oz (94.2 kg), last menstrual period 12/16/2015, SpO2 100 %. General appearance: alert, cooperative and no distress Back: no tenderness to percussion or palpation  Disposition: 01-Home or Self Care  Discharge Instructions    Call MD for:  persistant nausea and vomiting    Complete by:  As directed    Call MD for:  severe uncontrolled pain    Complete by:  As directed    Call MD for:  temperature >100.4    Complete by:  As directed    Diet - low sodium heart healthy    Complete by:  As directed    Discharge patient    Complete by:  As directed    Increase activity slowly    Complete by:  As directed        Medication List    STOP taking these medications   ibuprofen 200 MG tablet Commonly known as:  ADVIL,MOTRIN     TAKE these medications   albuterol 108 (90 Base) MCG/ACT inhaler Commonly known as:  PROVENTIL HFA;VENTOLIN HFA Inhale 2 puffs into the lungs every 4 (four) hours as needed. For asthma What changed:  Another medication with the same name was added. Make sure you understand how and when to take each.   albuterol (2.5 MG/3ML) 0.083% nebulizer solution Commonly known as:  PROVENTIL Inhale 3 mLs into the lungs every 4  (four) hours as needed for wheezing or shortness of breath. What changed:  You were already taking a medication with the same name, and this prescription was added. Make sure you understand how and when to take each.   AZO TABS PO Take 2 tablets by mouth 2 (two) times daily as needed.   cephALEXin 500 MG capsule Commonly known as:  KEFLEX Take 1 capsule (500 mg total) by mouth 4 (four) times daily.   cephALEXin 500 MG capsule Commonly known as:  KEFLEX Take 1 capsule (500 mg total) by mouth every 6 (six) hours.   multivitamin with minerals Tabs tablet Take 1 tablet by mouth daily.   PROBIOTIC PO Take 2 each by mouth daily.   promethazine 25 MG tablet Commonly known as:  PHENERGAN Take 1 tablet (25 mg total) by mouth every 6 (six) hours as needed for nausea or vomiting.   promethazine 25 MG tablet Commonly known as:  PHENERGAN Take 1 tablet (25 mg total) by mouth every 6 (six) hours as needed for nausea.      Follow-up Information    [facility].   Why:  or OB/gyn of your choice to start prenatal care Contact information: [address] [place] [address] [phone]        [facility] ADMISSIONS Follow up.   Why:  for pregnancy related emergencies, worsening symptoms, or if can't keep down antibiotics Contact information: [id number] [address] [id number] [phone]       [facility] Follow up in 2 week(s).   Why:  follow up urine culture and evaluation Contact information: [address] [place] [phone]          Signed: Lazaro ArmsURE,LUTHER H 01/29/2016, 7:32 AM

## 2016-01-31 LAB — CULTURE, BLOOD (ROUTINE X 2)
CULTURE: NO GROWTH
Culture: NO GROWTH

## 2016-03-04 ENCOUNTER — Encounter (HOSPITAL_COMMUNITY): Payer: Self-pay

## 2016-03-04 ENCOUNTER — Inpatient Hospital Stay (HOSPITAL_COMMUNITY): Payer: Self-pay

## 2016-03-04 ENCOUNTER — Observation Stay (HOSPITAL_COMMUNITY)
Admission: AD | Admit: 2016-03-04 | Discharge: 2016-03-05 | Disposition: A | Discharge status: Home / Self Care | Payer: Self-pay | Source: Ambulatory Visit | Attending: Obstetrics and Gynecology | Admitting: Obstetrics and Gynecology

## 2016-03-04 DIAGNOSIS — J45909 Unspecified asthma, uncomplicated: Secondary | ICD-10-CM | POA: Insufficient documentation

## 2016-03-04 DIAGNOSIS — Z3A11 11 weeks gestation of pregnancy: Secondary | ICD-10-CM | POA: Insufficient documentation

## 2016-03-04 DIAGNOSIS — F17210 Nicotine dependence, cigarettes, uncomplicated: Secondary | ICD-10-CM | POA: Insufficient documentation

## 2016-03-04 DIAGNOSIS — O26611 Liver and biliary tract disorders in pregnancy, first trimester: Secondary | ICD-10-CM

## 2016-03-04 DIAGNOSIS — O99331 Smoking (tobacco) complicating pregnancy, first trimester: Secondary | ICD-10-CM | POA: Insufficient documentation

## 2016-03-04 DIAGNOSIS — K8020 Calculus of gallbladder without cholecystitis without obstruction: Secondary | ICD-10-CM | POA: Diagnosis present

## 2016-03-04 DIAGNOSIS — O2301 Infections of kidney in pregnancy, first trimester: Secondary | ICD-10-CM

## 2016-03-04 DIAGNOSIS — R112 Nausea with vomiting, unspecified: Secondary | ICD-10-CM

## 2016-03-04 DIAGNOSIS — O99611 Diseases of the digestive system complicating pregnancy, first trimester: Principal | ICD-10-CM | POA: Insufficient documentation

## 2016-03-04 DIAGNOSIS — R1011 Right upper quadrant pain: Secondary | ICD-10-CM

## 2016-03-04 HISTORY — DX: Tubulo-interstitial nephritis, not specified as acute or chronic: N12

## 2016-03-04 LAB — URINALYSIS, ROUTINE W REFLEX MICROSCOPIC
Bilirubin Urine: NEGATIVE
GLUCOSE, UA: NEGATIVE mg/dL
Ketones, ur: NEGATIVE mg/dL
NITRITE: POSITIVE — AB
PROTEIN: 100 mg/dL — AB
Specific Gravity, Urine: 1.014 (ref 1.005–1.030)
pH: 5 (ref 5.0–8.0)

## 2016-03-04 LAB — COMPREHENSIVE METABOLIC PANEL
ALK PHOS: 54 U/L (ref 38–126)
ALT: 14 U/L (ref 14–54)
AST: 16 U/L (ref 15–41)
Albumin: 3.5 g/dL (ref 3.5–5.0)
Anion gap: 5 (ref 5–15)
BILIRUBIN TOTAL: 0.4 mg/dL (ref 0.3–1.2)
CALCIUM: 8.8 mg/dL — AB (ref 8.9–10.3)
CO2: 25 mmol/L (ref 22–32)
Chloride: 103 mmol/L (ref 101–111)
Creatinine, Ser: 0.51 mg/dL (ref 0.44–1.00)
GFR calc Af Amer: >60 mL/min (ref >60)
GFR calc non Af Amer: >60 mL/min (ref >60)
GLUCOSE: 112 mg/dL — AB (ref 65–99)
Potassium: 3.4 mmol/L — ABNORMAL LOW (ref 3.5–5.1)
Sodium: 133 mmol/L — ABNORMAL LOW (ref 135–145)
TOTAL PROTEIN: 6.3 g/dL — AB (ref 6.5–8.1)

## 2016-03-04 LAB — CBC WITH DIFFERENTIAL/PLATELET
BASOS ABS: 0 10*3/uL (ref 0.0–0.1)
BASOS PCT: 0 %
Eosinophils Absolute: 0 10*3/uL (ref 0.0–0.7)
Eosinophils Relative: 0 %
HEMATOCRIT: 32.4 % — AB (ref 36.0–46.0)
HEMOGLOBIN: 11.1 g/dL — AB (ref 12.0–15.0)
Lymphocytes Relative: 7 %
Lymphs Abs: 1 10*3/uL (ref 0.7–4.0)
MCH: 27.8 pg (ref 26.0–34.0)
MCHC: 34.3 g/dL (ref 30.0–36.0)
MCV: 81 fL (ref 78.0–100.0)
MONOS PCT: 5 %
Monocytes Absolute: 0.7 10*3/uL (ref 0.1–1.0)
NEUTROS ABS: 12.6 10*3/uL — AB (ref 1.7–7.7)
NEUTROS PCT: 88 %
Platelets: 362 10*3/uL (ref 150–400)
RBC: 4 MIL/uL (ref 3.87–5.11)
RDW: 13.5 % (ref 11.5–15.5)
WBC: 14.3 10*3/uL — ABNORMAL HIGH (ref 4.0–10.5)

## 2016-03-04 LAB — WET PREP, GENITAL
Clue Cells Wet Prep HPF POC: NONE SEEN
SPERM: NONE SEEN
Trich, Wet Prep: NONE SEEN
Yeast Wet Prep HPF POC: NONE SEEN

## 2016-03-04 LAB — AMYLASE: AMYLASE: 51 U/L (ref 28–100)

## 2016-03-04 LAB — LIPASE, BLOOD: Lipase: 16 U/L (ref 11–51)

## 2016-03-04 MED ORDER — DIPHENHYDRAMINE HCL 25 MG PO CAPS
25.0000 mg | ORAL_CAPSULE | Freq: Four times a day (QID) | ORAL | Status: DC | PRN
  Administered 2016-03-04 – 2016-03-05 (×2): 25 mg via ORAL
  Filled 2016-03-04 (×2): qty 1

## 2016-03-04 MED ORDER — OXYCODONE-ACETAMINOPHEN 5-325 MG PO TABS
1.0000 | ORAL_TABLET | ORAL | Status: DC | PRN
  Administered 2016-03-05: 2 via ORAL
  Filled 2016-03-04 (×2): qty 2

## 2016-03-04 MED ORDER — PRENATAL MULTIVITAMIN CH
1.0000 | ORAL_TABLET | Freq: Every day | ORAL | Status: DC
  Administered 2016-03-04: 1 via ORAL
  Filled 2016-03-04: qty 1

## 2016-03-04 MED ORDER — SODIUM CHLORIDE 0.9 % IV SOLN: 250.0000 mL | INTRAVENOUS | Status: DC | PRN

## 2016-03-04 MED ORDER — PANTOPRAZOLE SODIUM 40 MG PO TBEC
40.0000 mg | DELAYED_RELEASE_TABLET | Freq: Every day | ORAL | Status: DC
  Administered 2016-03-04: 40 mg via ORAL
  Filled 2016-03-04: qty 1

## 2016-03-04 MED ORDER — HYDROMORPHONE HCL 1 MG/ML IJ SOLN
1.0000 mg | Freq: Once | INTRAMUSCULAR | Status: AC
  Administered 2016-03-04: 1 mg via INTRAMUSCULAR
  Filled 2016-03-04: qty 1

## 2016-03-04 MED ORDER — SODIUM CHLORIDE 0.9% FLUSH
3.0000 mL | INTRAVENOUS | Status: DC | PRN
  Administered 2016-03-04: 10 mL via INTRAVENOUS
  Filled 2016-03-04: qty 3

## 2016-03-04 MED ORDER — ACETAMINOPHEN 325 MG PO TABS
650.0000 mg | ORAL_TABLET | ORAL | Status: DC | PRN
  Administered 2016-03-04: 650 mg via ORAL
  Filled 2016-03-04: qty 2

## 2016-03-04 MED ORDER — METOCLOPRAMIDE HCL 5 MG/ML IJ SOLN
10.0000 mg | Freq: Four times a day (QID) | INTRAMUSCULAR | Status: DC | PRN
  Administered 2016-03-04 – 2016-03-05 (×4): 10 mg via INTRAVENOUS
  Filled 2016-03-04 (×4): qty 2

## 2016-03-04 MED ORDER — DEXTROSE 5 % IV SOLN
1.0000 g | INTRAVENOUS | Status: DC
  Administered 2016-03-04: 1 g via INTRAVENOUS
  Filled 2016-03-04 (×2): qty 10

## 2016-03-04 MED ORDER — SODIUM CHLORIDE 0.9% FLUSH
3.0000 mL | Freq: Two times a day (BID) | INTRAVENOUS | Status: DC
  Administered 2016-03-04: 3 mL via INTRAVENOUS

## 2016-03-04 MED ORDER — HYDROMORPHONE HCL 1 MG/ML IJ SOLN
0.2000 mg | INTRAMUSCULAR | Status: DC | PRN
  Administered 2016-03-04 – 2016-03-05 (×5): 0.6 mg via INTRAVENOUS
  Filled 2016-03-04 (×5): qty 1

## 2016-03-04 MED ORDER — SODIUM CHLORIDE 0.9 % IV BOLUS (SEPSIS)
1500.0000 mL | Freq: Once | INTRAVENOUS | Status: AC
  Administered 2016-03-04: 1500 mL via INTRAVENOUS

## 2016-03-04 MED ORDER — ZOLPIDEM TARTRATE 5 MG PO TABS: 5.0000 mg | ORAL_TABLET | Freq: Every evening | ORAL | Status: DC | PRN

## 2016-03-04 NOTE — Progress Notes (Signed)
OB Note I went by to see how she was doing and patient was sleeping. In talking to the RN, patient is doing well and ate food well and is using less pain meds. Patient continues to be afebrile. Hope for d/c home tomorrow with [facility] outpatient follow up.  Evelyn Fields, Jr MD Attending Center for [facility] (Faculty Practice) 03/04/2016 Time: 1640

## 2016-03-04 NOTE — Progress Notes (Addendum)
Antepartum Note  D/w Dr. Dwain SarnaWakefield [facility] and will continue with conservative measures (ADAT to low fat, pain control and following of s/s) and if patient improved tomorrow then okay for outpatient management, but if s/s worsen or not significantly improved by tomorrow, then will formal consult them. Orders changed to add PPI and repeat CBC in AM. Patient also with recent [facility] admit for pyelo (11/21-25) and was put on keflex but unsure if pt on suppression after course for pan sensitive e. Coli. +U/a and UCx pending so will start rocephin 1gm qday which patient was on in the past w/o issues. Will also do a 1.5L NS bolus and then saline lock IV.  Patient continues to be afebrile  Kinney Bingharlie Jalise Zawistowski, Montez HagemanJr MD Attending Center for [facility](Faculty Practice)

## 2016-03-04 NOTE — MAU Provider Note (Signed)
History     CSN: [id number]  Arrival date and time: 03/04/16 4098   First Provider Initiated Contact with Patient 03/04/16 0256      Chief Complaint  Patient presents with  . Abdominal Pain   HPI Evelyn Fields is a 30 y.o. G1P0 at [redacted]w[redacted]d who presents to MAU today with multiple complaints. First, she states RUQ abdominal pain x 4 days, but worse today. She states pain is severe. She has taken Tylenol without relief. She states N/V associated with the pain, but also states that she has had N/V throughout the pregnancy. She states concern for vaginal infection and/or UTI. She has a history of pyelonephritis in this pregnancy. She denies dysuria, but states discomfort and irritation with urination. She denies vaginal bleeding, hematuria, flank pain or fever.   OB History    Gravida Para Term Preterm AB Living   1             SAB TAB Ectopic Multiple Live Births                  Past Medical History:  Diagnosis Date  . Alcohol dependence (HCC) 09/17/2012  . Anxiety   . Asthma   . Headache(784.0)   . Marijuana dependence (HCC) 09/17/2012  . Migraine, unspecified, without mention of intractable migraine without mention of status migrainosus 08/08/2012  . Pyelonephritis   . 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 Shingles 2012, May 2014   left arm    Past Surgical History:  Procedure Laterality Date  . 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 Cancer Paternal Grandfather   . Other Neg Hx     Social History  Substance Use Topics  . Smoking status: Current Every Day Smoker    Packs/day: 1.00    Years: 3.00    Types: Cigarettes  . Smokeless tobacco: Never Used  . Alcohol use Yes     Comment: "before preg"    Allergies:  Allergies  Allergen Reactions  . Imitrex [Sumatriptan] Other (See Comments)    Increases migraine  . Penicillins Other (See Comments)    Unknown  Childhood reaction Tolerates Ceftriaxone    Prescriptions Prior to Admission  Medication Sig Dispense Refill Last Dose  . Multiple Vitamin (MULTIVITAMIN WITH MINERALS) TABS tablet Take 1 tablet by mouth daily.   Past Week at Unknown time  . Phenazopyridine HCl (AZO TABS PO) Take 2 tablets by mouth 2 (two) times daily as needed.   03/03/2016 at Unknown time  . albuterol (PROVENTIL HFA;VENTOLIN HFA) 108 (90 BASE) MCG/ACT inhaler Inhale 2 puffs into the lungs every 4 (four) hours as needed. For asthma   Rescue  . albuterol (PROVENTIL) (2.5 MG/3ML) 0.083% nebulizer solution Inhale 3 mLs into the lungs every 4 (four) hours as needed for wheezing or shortness of breath. 75 mL 12   . cephALEXin (KEFLEX) 500 MG capsule Take 1 capsule (500 mg total) by mouth 4 (four) times daily. 28 capsule 0   . cephALEXin (KEFLEX) 500 MG capsule Take 1 capsule (500 mg total) by mouth every 6 (six) hours. 28 capsule 0   . Probiotic Product (PROBIOTIC PO) Take 2 each by mouth daily.   Past Month at Unknown time  . promethazine (PHENERGAN) 25 MG tablet Take 1 tablet (25 mg total) by mouth every 6 (six) hours as needed for nausea or vomiting.  30 tablet 0   . promethazine (PHENERGAN) 25 MG tablet Take 1 tablet (25 mg total) by mouth every 6 (six) hours as needed for nausea. 12 tablet 0     Review of Systems  Constitutional: Negative for fever and malaise/fatigue.  Gastrointestinal: Positive for abdominal pain, nausea and vomiting. Negative for constipation and diarrhea.  Genitourinary: Negative for dysuria, flank pain, frequency, hematuria and urgency.       Neg - vaginal bleeding + vaginal irritation, discharge   Physical Exam   Blood pressure 104/59, pulse 100, temperature 98.5 F (36.9 C), resp. rate 20, height 5\' 5"  (1.651 m), weight 210 lb 6.4 oz (95.4 kg), last menstrual period 12/16/2015, SpO2 99 %.  Physical Exam  Nursing note and vitals reviewed. Constitutional: She is oriented to person, place, and time.  She appears well-developed and well-nourished. No distress.  HENT:  Head: Normocephalic and atraumatic.  Cardiovascular: Normal rate.   Respiratory: Effort normal.  GI: Soft. She exhibits no distension and no mass. There is tenderness (moderate tenderness to palpation of the RUQ). There is no rebound, no guarding and no CVA tenderness.  Genitourinary: There is rash (mild erythema noted ) on the right labia. There is rash on the left labia. Cervix exhibits no motion tenderness, no discharge and no friability. No bleeding in the vagina. Vaginal discharge (small amount of thick, white discharge noted) found.  Neurological: She is alert and oriented to person, place, and time.  Skin: Skin is warm and dry. No erythema.  Psychiatric: She has a normal mood and affect.    Results for orders placed or performed during the hospital encounter of 03/04/16 (from the past 24 hour(s))  Urinalysis, Routine w reflex microscopic     Status: Abnormal   Collection Time: 03/04/16  2:50 AM  Result Value Ref Range   Color, Urine AMBER (A) YELLOW   APPearance CLOUDY (A) CLEAR   Specific Gravity, Urine 1.014 1.005 - 1.030   pH 5.0 5.0 - 8.0   Glucose, UA NEGATIVE NEGATIVE mg/dL   Hgb urine dipstick MODERATE (A) NEGATIVE   Bilirubin Urine NEGATIVE NEGATIVE   Ketones, ur NEGATIVE NEGATIVE mg/dL   Protein, ur 161100 (A) NEGATIVE mg/dL   Nitrite POSITIVE (A) NEGATIVE   Leukocytes, UA SMALL (A) NEGATIVE   RBC / HPF TOO NUMEROUS TO COUNT 0 - 5 RBC/hpf   WBC, UA TOO NUMEROUS TO COUNT 0 - 5 WBC/hpf   Bacteria, UA RARE (A) NONE SEEN   Squamous Epithelial / LPF 6-30 (A) NONE SEEN   WBC Clumps PRESENT    Mucous PRESENT   CBC with Differential/Platelet     Status: Abnormal   Collection Time: 03/04/16  3:02 AM  Result Value Ref Range   WBC 14.3 (H) 4.0 - 10.5 K/uL   RBC 4.00 3.87 - 5.11 MIL/uL   Hemoglobin 11.1 (L) 12.0 - 15.0 g/dL   HCT 09.632.4 (L) 04.536.0 - 40.946.0 %   MCV 81.0 78.0 - 100.0 fL   MCH 27.8 26.0 - 34.0 pg    MCHC 34.3 30.0 - 36.0 g/dL   RDW 81.113.5 91.411.5 - 78.215.5 %   Platelets 362 150 - 400 K/uL   Neutrophils Relative % 88 %   Neutro Abs 12.6 (H) 1.7 - 7.7 K/uL   Lymphocytes Relative 7 %   Lymphs Abs 1.0 0.7 - 4.0 K/uL   Monocytes Relative 5 %   Monocytes Absolute 0.7 0.1 - 1.0 K/uL   Eosinophils Relative 0 %   Eosinophils  Absolute 0.0 0.0 - 0.7 K/uL   Basophils Relative 0 %   Basophils Absolute 0.0 0.0 - 0.1 K/uL  Comprehensive metabolic panel     Status: Abnormal   Collection Time: 03/04/16  3:02 AM  Result Value Ref Range   Sodium 133 (L) 135 - 145 mmol/L   Potassium 3.4 (L) 3.5 - 5.1 mmol/L   Chloride 103 101 - 111 mmol/L   CO2 25 22 - 32 mmol/L   Glucose, Bld 112 (H) 65 - 99 mg/dL   BUN <5 (L) 6 - 20 mg/dL   Creatinine, Ser 2.130.51 0.44 - 1.00 mg/dL   Calcium 8.8 (L) 8.9 - 10.3 mg/dL   Total Protein 6.3 (L) 6.5 - 8.1 g/dL   Albumin 3.5 3.5 - 5.0 g/dL   AST 16 15 - 41 U/L   ALT 14 14 - 54 U/L   Alkaline Phosphatase 54 38 - 126 U/L   Total Bilirubin 0.4 0.3 - 1.2 mg/dL   GFR calc non Af Amer >60 >60 mL/min   GFR calc Af Amer >60 >60 mL/min   Anion gap 5 5 - 15  Wet prep, genital     Status: Abnormal   Collection Time: 03/04/16  3:10 AM  Result Value Ref Range   Yeast Wet Prep HPF POC NONE SEEN NONE SEEN   Trich, Wet Prep NONE SEEN NONE SEEN   Clue Cells Wet Prep HPF POC NONE SEEN NONE SEEN   WBC, Wet Prep HPF POC FEW (A) NONE SEEN   Sperm NONE SEEN     MAU Course  Procedures None  MDM FHR - 172 bpm with doppler  UA, wet prep, CBC and CMP today  Discussed with Dr. Alysia PennaErvin. Admit for observation for anti-emetics and pain control. Consult general surgery and dietician. Culture urine sample prior to treatment.  Assessment and Plan  A: SIUP at 7782w2d Cholelithiasis  P:  Admit for observation   Marny LowensteinJulie N Odesser Tourangeau, PA-C  03/04/2016, 4:24 AM

## 2016-03-04 NOTE — MAU Note (Addendum)
Severe pain R side for 3 days that extends alittle toward mid abdomen. Vomiting some which goes along with pregnancy and sure I have a uti and yeast infection again. Denies bleeding. Some white clumpy vag d/c. [facility] with [facility]. Some chills today too

## 2016-03-04 NOTE — H&P (Signed)
Evelyn DeedsSasha Fields is a 30 y.o. female G1P0 IUP 2911 w2d presented to MAU with multiple complaints. Her main complaint is abd pain for the last 4 days. Worsen today. Some N/V with the pain. Pt was admitted back in November for pyelonephritis.   W/U in the MAU revealed a slightly elevated WBC, abd U/S showed gallstones.  Pt still had abd pain after pain medication. Pt was admitted for further evaluation by surgery and pain management. OB History    Gravida Para Term Preterm AB Living   1             SAB TAB Ectopic Multiple Live Births                 Past Medical History:  Diagnosis Date  . Alcohol dependence (HCC) 09/17/2012  . Anxiety   . Asthma   . Headache(784.0)   . Marijuana dependence (HCC) 09/17/2012  . Migraine, unspecified, without mention of intractable migraine without mention of status migrainosus 08/08/2012  . Pyelonephritis   . 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 Shingles 2012, May 2014   left arm   Past Surgical History:  Procedure Laterality Date  . 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 Social History:  reports that she has been smoking Cigarettes.  She has a 3.00 pack-year smoking history. She has never used smokeless tobacco. She reports that she drinks alcohol. She reports that she uses drugs, including Marijuana.      ROS History   Blood pressure 101/63, pulse 89, temperature 98.5 F (36.9 C), temperature source Oral, resp. rate 20, height 5\' 5"  (1.651 m), weight 95.4 kg (210 lb 6.4 oz), last menstrual period 12/16/2015, SpO2 98 %.   Exam Physical Exam  WDWN female in NAD Lungs clear Heart RRR Abd soft + bs  Tenderness RUQ no rebound   Assessment/Plan: IUP 5484w2d Cholelithiasis  Will admit pt for pain management. Diet eduction and evaluation by general surgery.   Hermina StaggersMichael L Jaelen Fields 03/04/2016, 7:36 AM

## 2016-03-04 NOTE — Progress Notes (Signed)
Nutrition Education Note  RD consulted for nutrition education regarding a Low fat diet.    RD provided "Diet for Gallstones" handout and Low fat Diet handout from the Academy of [employer] and Dietetics. Reviewed patient's dietary recall. Provided examples on ways to decrease fat intake in diet. Discouraged intake of processed foods and  of high fat foods. Discouraged consumption of gas producing foods. Encouraged fresh fruits and vegetables as well as whole grain sources of carbohydrates to maximize fiber intake. Teach back method used.  Expect good compliance. Pt very attentive   Body mass index is 33.66 kg/m. Pt meets criteria for obesity based on current BMI.  Current diet order is low fat, patient starting to try to eat [place] and fresh fruit as education session completed. Labs and medications reviewed. No further nutrition interventions warranted at this time.If additional nutrition issues arise, please re-consult RD.  Evelyn Fields M.Odis LusterEd. R.D. LDN Neonatal Nutrition Support Specialist/RD III Pager [phone]      Phone [phone]

## 2016-03-05 DIAGNOSIS — K8020 Calculus of gallbladder without cholecystitis without obstruction: Secondary | ICD-10-CM

## 2016-03-05 DIAGNOSIS — O99611 Diseases of the digestive system complicating pregnancy, first trimester: Secondary | ICD-10-CM

## 2016-03-05 DIAGNOSIS — Z3A11 11 weeks gestation of pregnancy: Secondary | ICD-10-CM

## 2016-03-05 LAB — CBC WITH DIFFERENTIAL/PLATELET
BASOS ABS: 0 10*3/uL (ref 0.0–0.1)
Basophils Relative: 0 %
EOS PCT: 0 %
Eosinophils Absolute: 0 10*3/uL (ref 0.0–0.7)
HEMATOCRIT: 31.1 % — AB (ref 36.0–46.0)
Hemoglobin: 10.8 g/dL — ABNORMAL LOW (ref 12.0–15.0)
LYMPHS PCT: 13 %
Lymphs Abs: 1.2 10*3/uL (ref 0.7–4.0)
MCH: 28 pg (ref 26.0–34.0)
MCHC: 34.7 g/dL (ref 30.0–36.0)
MCV: 80.6 fL (ref 78.0–100.0)
Monocytes Absolute: 0.4 10*3/uL (ref 0.1–1.0)
Monocytes Relative: 5 %
NEUTROS ABS: 7.3 10*3/uL (ref 1.7–7.7)
Neutrophils Relative %: 82 %
PLATELETS: 349 10*3/uL (ref 150–400)
RBC: 3.86 MIL/uL — ABNORMAL LOW (ref 3.87–5.11)
RDW: 13.5 % (ref 11.5–15.5)
WBC: 8.9 10*3/uL (ref 4.0–10.5)

## 2016-03-05 MED ORDER — PRENATAL MULTIVITAMIN CH: 1.0000 | ORAL_TABLET | Freq: Every day | ORAL | 2 refills | Status: DC

## 2016-03-05 MED ORDER — TERCONAZOLE 0.4 % VA CREA: 1.0000 | TOPICAL_CREAM | Freq: Every day | VAGINAL | 2 refills | Status: DC

## 2016-03-05 MED ORDER — CEPHALEXIN 250 MG PO CAPS: ORAL_CAPSULE | ORAL | 3 refills | Status: DC

## 2016-03-05 MED ORDER — OXYCODONE-ACETAMINOPHEN 5-325 MG PO TABS: 1.0000 | ORAL_TABLET | Freq: Four times a day (QID) | ORAL | 0 refills | Status: DC | PRN

## 2016-03-05 MED ORDER — RANITIDINE HCL 150 MG PO CAPS: 150.0000 mg | ORAL_CAPSULE | Freq: Two times a day (BID) | ORAL | Status: DC

## 2016-03-05 NOTE — Discharge Instructions (Signed)
Fat and Cholesterol Restricted Diet Introduction Getting too much fat and cholesterol in your diet may cause health problems. Following this diet helps keep your fat and cholesterol at normal levels. This can keep you from getting sick. What types of fat should I choose?  Choose monosaturated and polyunsaturated fats. These are found in foods such as olive oil, canola oil, flaxseeds, walnuts, almonds, and seeds.  Eat more omega-3 fats. Good choices include salmon, mackerel, sardines, tuna, flaxseed oil, and ground flaxseeds.  Limit saturated fats. These are in animal products such as meats, butter, and cream. They can also be in plant products such as palm oil, palm kernel oil, and coconut oil.  Avoid foods with partially hydrogenated oils in them. These contain trans fats. Examples of foods that have trans fats are stick margarine, some tub margarines, cookies, crackers, and other baked goods. What general guidelines do I need to follow?  Check food labels. Look for the words "trans fat" and "saturated fat."  When preparing a meal:  Fill half of your plate with vegetables and green salads.  Fill one fourth of your plate with whole grains. Look for the word "whole" as the first word in the ingredient list.  Fill one fourth of your plate with lean protein foods.  Eat more foods that have fiber, like apples, carrots, beans, peas, and barley.  Eat more home-cooked foods. Eat less at restaurants and buffets.  Limit or avoid alcohol.  Limit foods high in starch and sugar.  Limit fried foods.  Cook foods without frying them. Baking, boiling, grilling, and broiling are all great options.  Lose weight if you are overweight. Losing even a small amount of weight can help your overall health. It can also help prevent diseases such as diabetes and heart disease. What foods can I eat? Grains  Whole grains, such as whole wheat or whole grain breads, crackers, cereals, and pasta. Unsweetened  oatmeal, bulgur, barley, quinoa, or brown rice. Corn or whole wheat flour tortillas. Vegetables  Fresh or frozen vegetables (raw, steamed, roasted, or grilled). Green salads. Fruits  All fresh, canned (in natural juice), or frozen fruits. Meat and Other Protein Products  Ground beef (85% or leaner), grass-fed beef, or beef trimmed of fat. Skinless chicken or [place]. Ground chicken or [place]. Pork trimmed of fat. All fish and seafood. Eggs. Dried beans, peas, or lentils. Unsalted nuts or seeds. Unsalted canned or dry beans. Dairy  Low-fat dairy products, such as skim or 1% milk, 2% or reduced-fat cheeses, low-fat ricotta or cottage cheese, or plain low-fat yogurt. Fats and Oils  Tub margarines without trans fats. Light or reduced-fat mayonnaise and salad dressings. Avocado. Olive, canola, sesame, or safflower oils. Natural peanut or almond butter (choose ones without added sugar and oil). The items listed above may not be a complete list of recommended foods or beverages. Contact your dietitian for more options.  What foods are not recommended? Grains  White bread. White pasta. White rice. Cornbread. Bagels, pastries, and croissants. Crackers that contain trans fat. Vegetables  White potatoes. Corn. Creamed or fried vegetables. Vegetables in a cheese sauce. Fruits  Dried fruits. Canned fruit in light or heavy syrup. Fruit juice. Meat and Other Protein Products  Fatty cuts of meat. Ribs, chicken wings, bacon, sausage, bologna, salami, chitterlings, fatback, hot dogs, bratwurst, and packaged luncheon meats. Liver and organ meats. Dairy  Whole or 2% milk, cream, half-and-half, and cream cheese. Whole milk cheeses. Whole-fat or sweetened yogurt. Full-fat cheeses. Nondairy creamers and  whipped toppings. Processed cheese, cheese spreads, or cheese curds. Sweets and Desserts  Corn syrup, sugars, honey, and molasses. Candy. Jam and jelly. Syrup. Sweetened cereals. Cookies, pies, cakes, donuts,  muffins, and ice cream. Fats and Oils  Butter, stick margarine, lard, shortening, ghee, or bacon fat. Coconut, palm kernel, or palm oils. Beverages  Alcohol. Sweetened drinks (such as sodas, lemonade, and fruit drinks or punches). The items listed above may not be a complete list of foods and beverages to avoid. Contact your dietitian for more information.  This information is not intended to replace advice given to you by your health care provider. Make sure you discuss any questions you have with your health care provider. Document Released: 08/22/2011 Document Revised: 10/28/2015 Document Reviewed: 05/22/2013  2017 Elsevier Cholelithiasis Cholelithiasis is also called "gallstones." It is a kind of gallbladder disease. The gallbladder is an organ that stores a liquid (bile) that helps you digest fat. Gallstones may not cause symptoms (may be silent gallstones) until they cause a blockage, and then they can cause pain (gallbladder attack). Follow these instructions at home:  Take over-the-counter and prescription medicines only as told by your doctor.  Stay at a healthy weight.  Eat healthy foods. This includes:  Eating fewer fatty foods, like fried foods.  Eating fewer refined carbs (refined carbohydrates). Refined carbs are breads and grains that are highly processed, like white bread and white rice. Instead, choose whole grains like whole-wheat bread and brown rice.  Eating more fiber. Almonds, fresh fruit, and beans are healthy sources of fiber.  Keep all follow-up visits as told by your doctor. This is important. Contact a doctor if:  You have sudden pain in the upper right side of your belly (abdomen). Pain might spread to your right shoulder or your chest. This may be a sign of a gallbladder attack.  You feel sick to your stomach (are nauseous).  You throw up (vomit).  You have been diagnosed with gallstones that have no symptoms and you get:  Belly pain.  Discomfort,  burning, or fullness in the upper part of your belly (indigestion). Get help right away if:  You have sudden pain in the upper right side of your belly, and it lasts for more than 2 hours.  You have belly pain that lasts for more than 5 hours.  You have a fever or chills.  You keep feeling sick to your stomach or you keep throwing up.  Your skin or the whites of your eyes turn yellow (jaundice).  You have dark-colored pee (urine).  You have light-colored poop (stool). Summary  Cholelithiasis is also called "gallstones."  The gallbladder is an organ that stores a liquid (bile) that helps you digest fat.  Silent gallstones are gallstones that do not cause symptoms.  A gallbladder attack may cause sudden pain in the upper right side of your belly. Pain might spread to your right shoulder or your chest. If this happens, contact your doctor.  If you have sudden pain in the upper right side of your belly that lasts for more than 2 hours, get help right away. This information is not intended to replace advice given to you by your health care provider. Make sure you discuss any questions you have with your health care provider. Document Released: 08/09/2007 Document Revised: 11/07/2015 Document Reviewed: 11/07/2015 Elsevier Interactive Patient Education  2017 [employer]

## 2016-03-05 NOTE — Progress Notes (Signed)
Patient given discharge instructions, questions answered, states understanding, signed and given copy

## 2016-03-05 NOTE — Discharge Summary (Addendum)
Discharge Summary   Admit Date: 03/04/2016 Discharge Date: 03/05/2016 Discharging Service: Antepartum  Primary OBGYN: None Admitting Physician: Hermina StaggersMichael L Ervin, MD  Discharge Physician: Vergie LivingPickens, MD  Referring Provider: [facility]  Primary Care Provider: Lavell IslamLOWARD,DAVIS L, MD  Admission Diagnoses: *Pregnancy at 11/2 *Abdominal Pain *Gallstones  Discharge Diagnoses: *Pregnancy at 11/3 *Improved abdominal pain *Gallstones  Consult Orders: CONSULT TO DIETITIAN  Case d/w [facility]  Surgeries/Procedures Performed: None  History and Physical: Evelyn DeedsSasha Fields is a 30 y.o. female G1P0 IUP 11 w2d presented to [facility] with multiple complaints. Her main complaint is abd pain for the last 4 days. Worsen today. Some N/V with the pain. Pt was admitted back in November for pyelonephritis.   W/U in the [facility] revealed a slightly elevated WBC, abd U/S showed gallstones.  Pt still had abd pain after pain medication. Pt was admitted for further evaluation by surgery and pain management.         OB History    Gravida Para Term Preterm AB Living   1             SAB TAB Ectopic Multiple Live Births                     Past Medical History:  Diagnosis Date  . Alcohol dependence (HCC) 09/17/2012  . Anxiety   . Asthma   . Headache(784.0)   . Marijuana dependence (HCC) 09/17/2012  . Migraine, unspecified, without mention of intractable migraine without mention of status migrainosus 08/08/2012  . Pyelonephritis   . 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 Shingles 2012, May 2014   left arm        Past Surgical History:  Procedure Laterality Date  . 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 Social History:  reports that she has been smoking Cigarettes.  She has a 3.00 pack-year smoking history. She has never used smokeless tobacco. She reports that she drinks alcohol.  She reports that she uses drugs, including Marijuana.      ROS History   Blood pressure 101/63, pulse 89, temperature 98.5 F (36.9 C), temperature source Oral, resp. rate 20, height 5\' 5"  (1.651 m), weight 95.4 kg (210 lb 6.4 oz), last menstrual period 12/16/2015, SpO2 98 %.   Exam Physical Exam  WDWN female in NAD Lungs clear     Heart RRR Abd soft + bs  Tenderness RUQ no rebound   Assessment/Plan: IUP 3760w2d Cholelithiasis  Will admit pt for pain management. Diet eduction and evaluation by general surgery.          Hermina StaggersMichael L Ervin 03/04/2016, 7:36 AM   Hospital Course: *Pregnancy: routine care. FHTs daily *Abdominal Pain: diagnosed with gallstones w/o e/o infection on HD#1. S/s improved with expectant management, PO pain medications and low fat diet. Patient seen by nutrition, and case d/w [facility] who agreed with recommendations. Patient will need outpatient follow up with [facility]. On the day of discharge, pain was minimal and patient was tolerating PO intake w/o difficulty and no constitutional s/s. She was afebrile during her entire stay.  CBC Latest Ref Rng & Units 03/05/2016 03/04/2016 01/27/2016  WBC 4.0 - 10.5 K/uL 8.9 14.3(H) 6.7  Hemoglobin 12.0 - 15.0 g/dL 10.8(L) 11.1(L) 10.5(L)  Hematocrit 36.0 - 46.0 % 31.1(L) 32.4(L) 30.6(L)  Platelets 150 - 400 K/uL 349 362 256   *History of pyelonephritis: patient not  on suppression therapy on admission. Patient given rocephin 1gm x 1 on HD#1 given her history and still + u/a, but had negative back exam. Ucx pending on d/c. Pt put on keflex suppression for remainder of pregnancy *FEN/GI: low fat diet *PPx: OOB ad lib, SCDs.   Discharge Exam:    Current Vital Signs 24h Vital Sign Ranges  T 99.6 F (37.6 C) Temp  Avg: 99.3 F (37.4 C)  Min: 99 F (37.2 C)  Max: 99.6 F (37.6 C)  BP 116/62 BP  Min: 116/62  Max: 117/69  HR (!) 102 Pulse  Avg: 105  Min: 102  Max: 108  RR 18 Resp  Avg: 18  Min: 18  Max: 18  SaO2 98 %  Not Delivered SpO2  Avg: 99 %  Min: 98 %  Max: 100 %       24 Hour I/O Current Shift I/O  Time Ins Outs 12/30 0701 - 12/31 0700 In: 360 [P.O.:360] Out: -  No intake/output data recorded.   Patient Vitals for the past 12 hrs:  BP Temp Temp src Pulse Resp SpO2  03/04/16 2133 116/62 99.6 F (37.6 C) Oral (!) 102 18 98 %   General appearance: Well nourished, well developed female in no acute distress.  Cardiovascular: S1, S2 normal, no murmur, rub or gallop, regular rate and rhythm Respiratory:  Clear to auscultation bilateral. Normal respiratory effort Abdomen: positive bowel sounds and no masses, hernias; diffusely non tender to palpation, non distended Neuro/Psych:  Normal mood and affect. Back: no CVAT  Skin:  Warm and dry.  Lymphatic:  No inguinal lymphadenopathy.   Discharge Disposition:  Home  Patient Instructions:  Standard. Low fat diet   Results Pending at Discharge:  UCx  Discharge Medications: Allergies as of 03/05/2016      Reactions   Imitrex [sumatriptan] Other (See Comments)   Pt states that this medication does not work for her migraines.     Penicillins Other (See Comments)   Reaction:  Unknown  Has patient had a PCN reaction causing immediate rash, facial/tongue/throat swelling, SOB or lightheadedness with hypotension: Unsure Has patient had a PCN reaction causing severe rash involving mucus membranes or skin necrosis: Unsure Has patient had a PCN reaction that required hospitalization Unsure  Has patient had a PCN reaction occurring within the last 10 years: No If all of the above answers are "NO", then may proceed with Cephalosporin use.      Medication List    STOP taking these medications   multivitamin with minerals Tabs tablet     TAKE these medications   albuterol 108 (90 Base) MCG/ACT inhaler Commonly known as:  PROVENTIL HFA;VENTOLIN HFA Inhale 1-2 puffs into the lungs every 4 (four) hours as needed for wheezing or shortness of breath.    albuterol (2.5 MG/3ML) 0.083% nebulizer solution Commonly known as:  PROVENTIL Inhale 3 mLs into the lungs every 4 (four) hours as needed for wheezing or shortness of breath.   cephALEXin 250 MG capsule Commonly known as:  KEFLEX One tab po qhs for the rest of pregnancy   oxyCODONE-acetaminophen 5-325 MG tablet Commonly known as:  PERCOCET/ROXICET Take 1 tablet by mouth every 6 (six) hours as needed for severe pain.   phenazopyridine 95 MG tablet Commonly known as:  PYRIDIUM Take 190 mg by mouth 3 (three) times daily as needed for pain.   prenatal multivitamin Tabs tablet Take 1 tablet by mouth daily at 12 noon.   ranitidine 150 MG capsule  Commonly known as:  ZANTAC Take 1 capsule (150 mg total) by mouth 2 (two) times daily.   terconazole 0.4 % vaginal cream Commonly known as:  TERAZOL 7 Place 1 applicator vaginally at bedtime.        No future appointments.  In basket message sent to [facility] to set up NOB visit with [facility] or [facility] for sometime in the next two weeks.   Cornelia Copaharlie Philbert Ocallaghan, Jr. MD Attending Center [facility](Faculty Practice)

## 2016-03-06 LAB — CULTURE, OB URINE

## 2016-03-09 ENCOUNTER — Telehealth: Payer: Self-pay

## 2016-03-09 ENCOUNTER — Other Ambulatory Visit: Payer: Self-pay | Admitting: Medical

## 2016-03-09 DIAGNOSIS — O2341 Unspecified infection of urinary tract in pregnancy, first trimester: Secondary | ICD-10-CM

## 2016-03-09 MED ORDER — CEPHALEXIN 500 MG PO CAPS: 500.0000 mg | ORAL_CAPSULE | Freq: Four times a day (QID) | ORAL | 0 refills | Status: DC

## 2016-03-09 NOTE — Telephone Encounter (Signed)
Patient is currently taking her keflex given at visit

## 2016-03-10 ENCOUNTER — Telehealth: Payer: Self-pay

## 2016-03-10 NOTE — Telephone Encounter (Signed)
Per Evelyn FanningJulie, pt has a UTI.  Pt is already taking Keflex daily dose but needs to take Keflex po qid for 7 days then continue to daily dose.   LM for pt that she has a UTI and Rx has been sent to [facility] [address]/ [place] that she will need to take four times a day.  If she has any questions contact the office.

## 2016-03-15 ENCOUNTER — Telehealth: Payer: Self-pay | Admitting: *Deleted

## 2016-03-15 NOTE — Telephone Encounter (Addendum)
Pt left message yesterday stating that she is calling back about her antibiotics and also wants pain med refill.   1430  Called pt and discussed her concerns. I clarified that she needs treatment dose of antibiotics for 7 days due to active UTI, then will return to prophylactic dose. Pt voiced understanding and also asked for pain medication refill due to RUQ pain. I consulted with Dr. Vergie LivingPickens and he recommended that pt have evaluation of her pain and declined additional pain medication at this time. He also advised that referral be initiated for pt to see a general surgeon. I informed pt of the doctor's recommendations.Currently pt does not have insurance and will need to check her finances for referral to general surgeon. I advised that she may come to [facility] for evaluation of her current pain or go to ED in [place] (where she currently lives) for evaluation. I also attempted to reschedule pt's New Ob appt to a sooner date however no appt was available. Pt voiced understanding of all information and instructions given.

## 2016-03-23 ENCOUNTER — Encounter: Payer: Self-pay | Admitting: Obstetrics

## 2016-11-29 ENCOUNTER — Encounter (HOSPITAL_COMMUNITY): Payer: Self-pay

## 2017-01-23 IMAGING — US US ABDOMEN LIMITED
1 series · 15 of 25 positions shown · non-contrast
Comparison: None.

CLINICAL DATA: Right upper quadrant pain for 3 days. Nausea and
vomiting. Pregnant patient in first-trimester pregnancy

EXAM:
US ABDOMEN LIMITED - RIGHT UPPER QUADRANT

[Series 1: us abdomen limited · 15 of 45 slices shown]
[im 1/45]
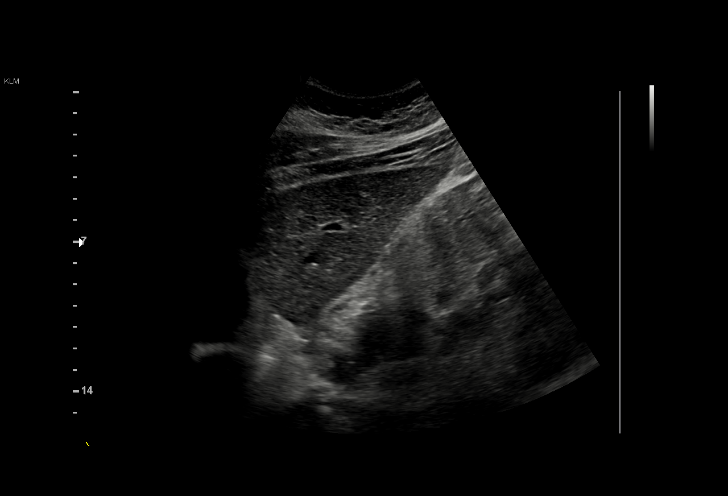
[im 4/45]
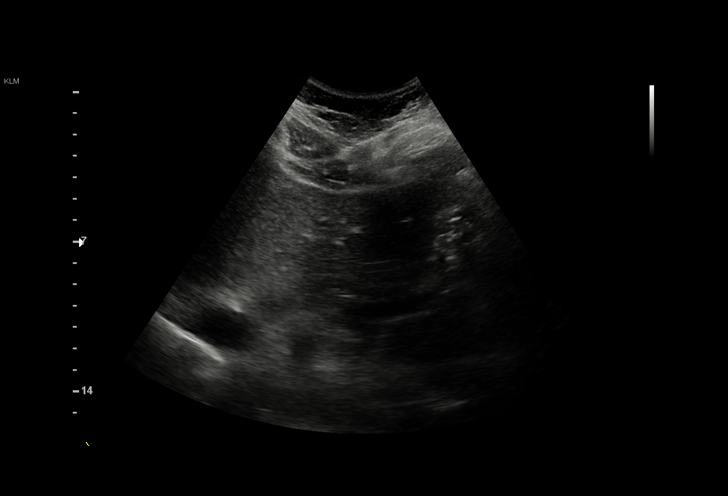
[im 8/45]
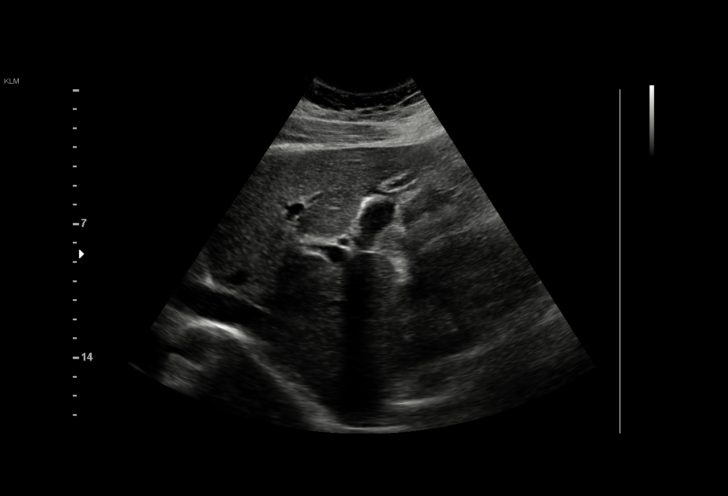
[im 10/45]
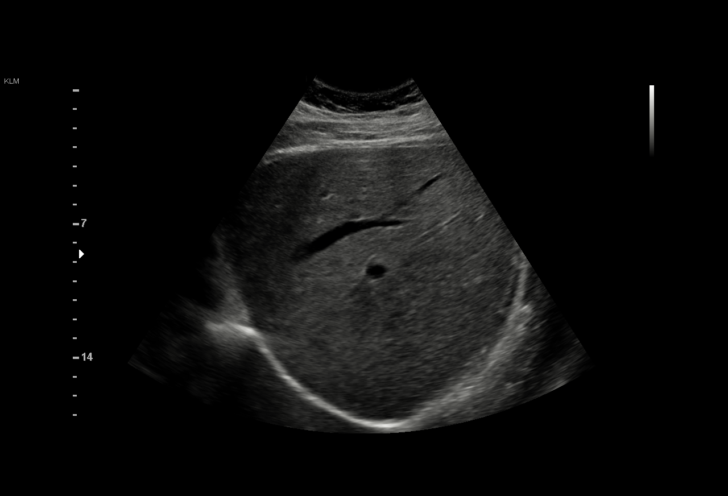
[im 13/45]
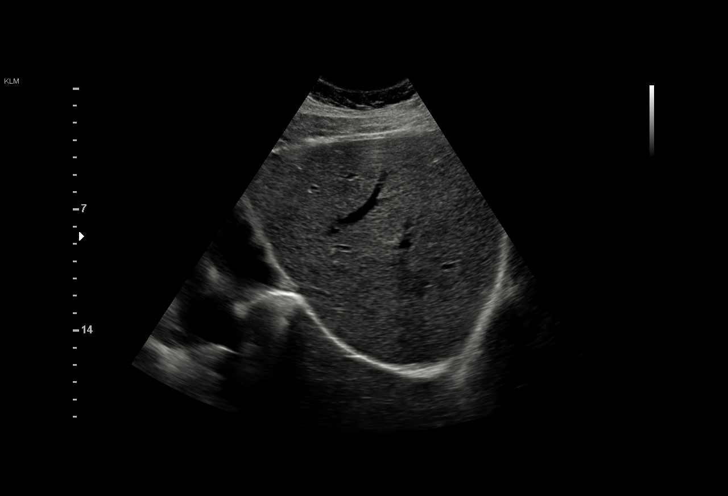
[im 17/45]
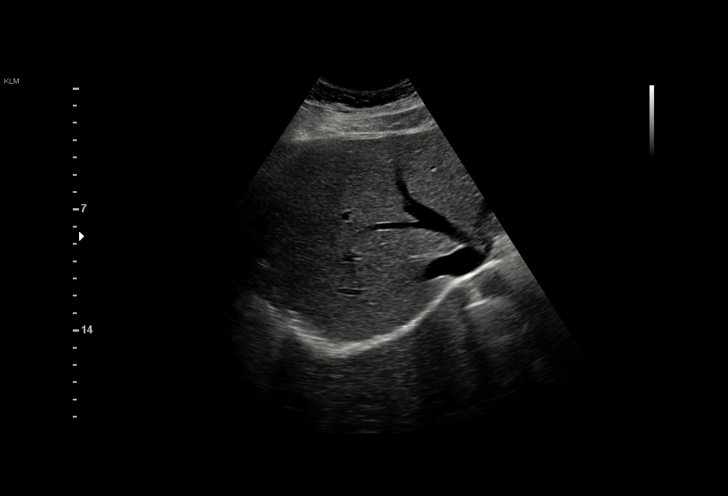
[im 19/45]
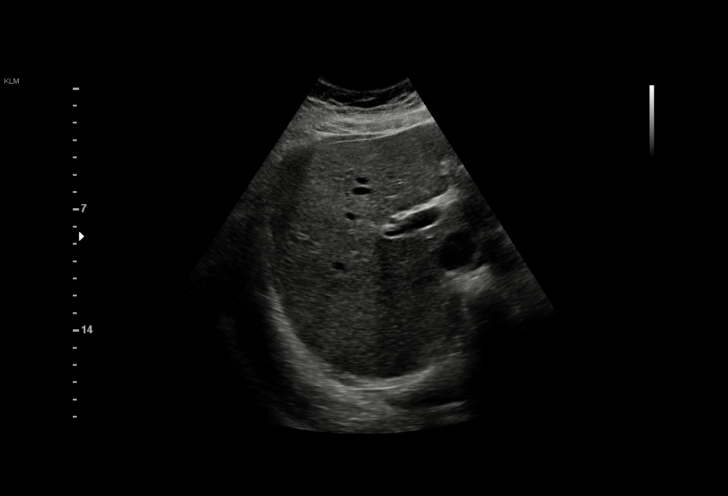
[im 23/45]
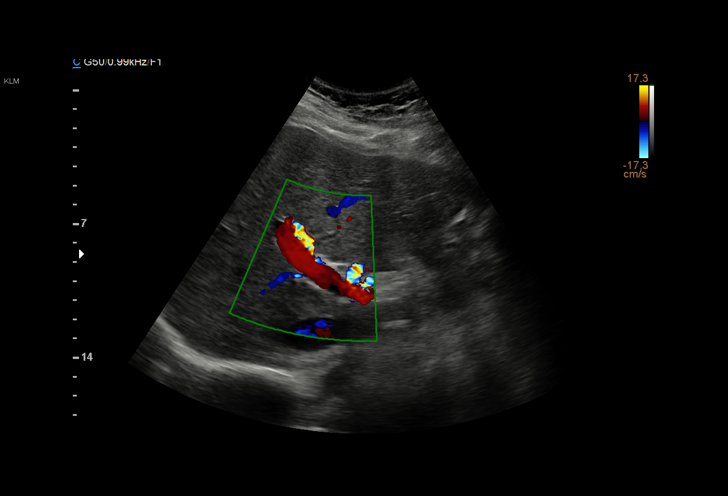
[im 26/45]
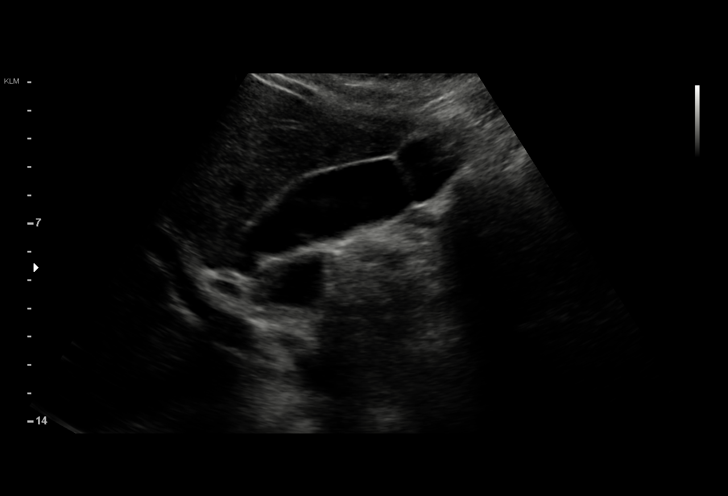
[im 28/45]
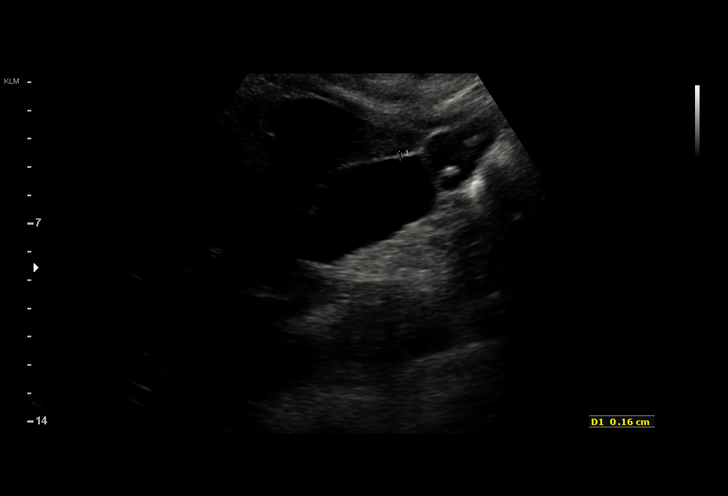
[im 32/45]
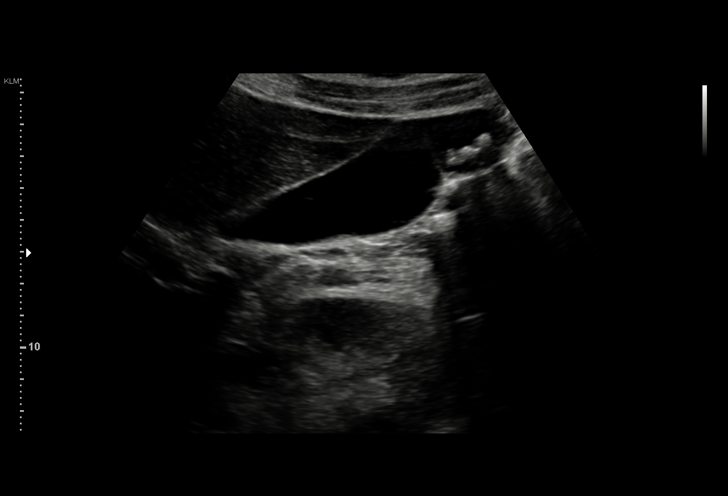
[im 35/45]
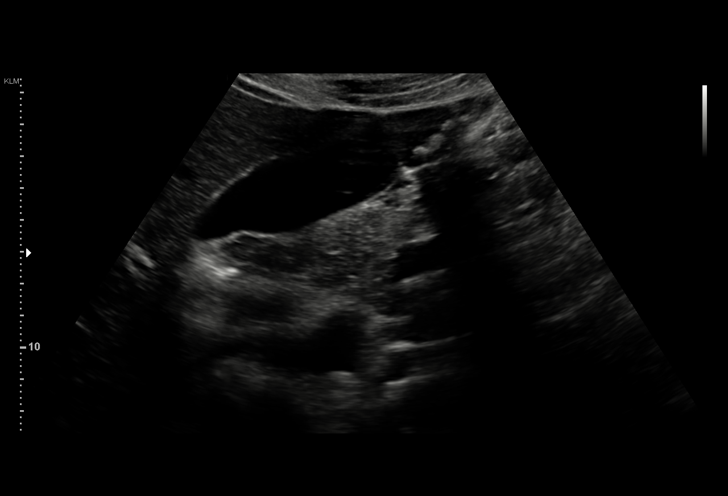
[im 37/45]
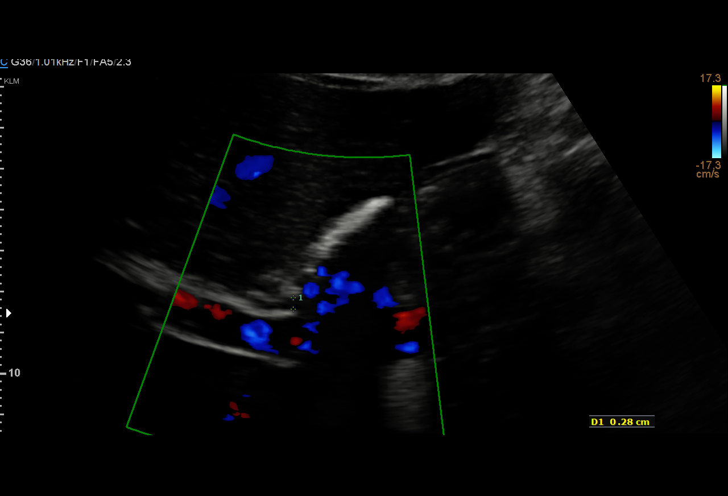
[im 41/45]
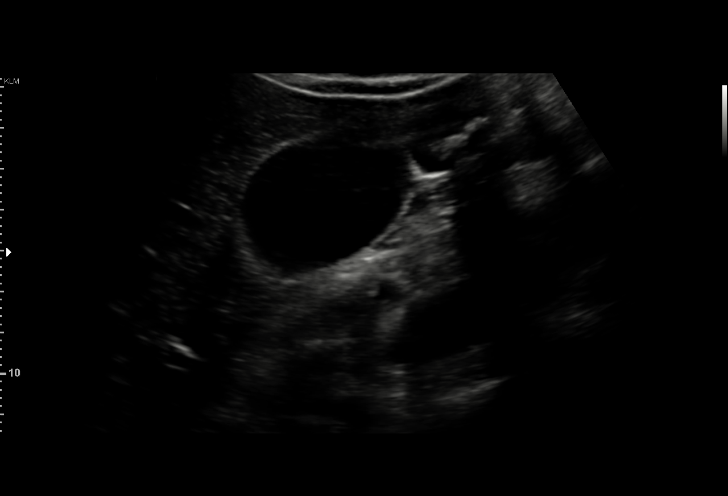
[im 45/45]
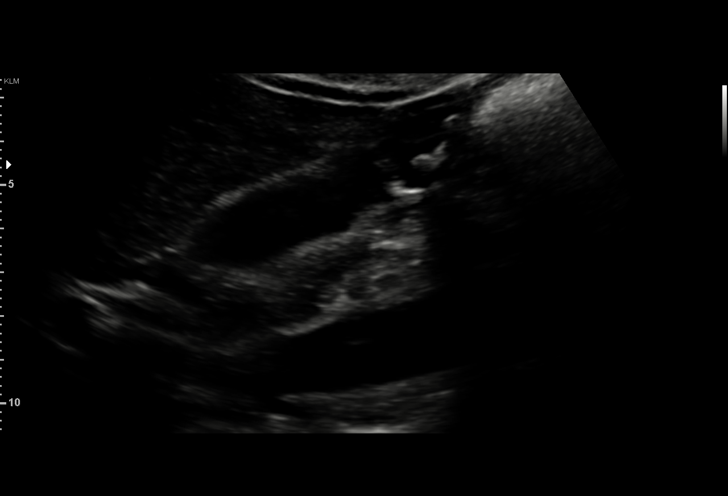

[15 of 25 positions shown; findings below may reference images not displayed]

FINDINGS: Gallbladder:

Physiologically distended. Multiple mobile gallstones noted about
the fundus distal to the gallbladder fold. No wall thickening,
normal wall thickness of less than 2 mm. No sonographic Murphy sign
noted by sonographer.

Common bile duct:

Diameter: 3 mm, normal.

Liver:

No focal lesion identified. Within normal limits in parenchymal
echogenicity. Normal directional flow in the main portal vein.
IMPRESSION: Cholelithiasis without sonographic findings of acute cholecystitis.
No biliary dilatation.

## 2019-07-07 ENCOUNTER — Ambulatory Visit (HOSPITAL_COMMUNITY)
Admission: EM | Admit: 2019-07-07 | Discharge: 2019-07-07 | Disposition: A | Discharge status: Home / Self Care | Payer: Medicaid Other | Attending: Physician Assistant | Admitting: Physician Assistant

## 2019-07-07 ENCOUNTER — Encounter (HOSPITAL_COMMUNITY): Payer: Self-pay

## 2019-07-07 ENCOUNTER — Other Ambulatory Visit: Payer: Self-pay

## 2019-07-07 DIAGNOSIS — K047 Periapical abscess without sinus: Secondary | ICD-10-CM | POA: Diagnosis not present

## 2019-07-07 MED ORDER — ACETAMINOPHEN 500 MG PO TABS: 1000.0000 mg | ORAL_TABLET | Freq: Four times a day (QID) | ORAL | 0 refills | Status: AC | PRN

## 2019-07-07 MED ORDER — KETOROLAC TROMETHAMINE 30 MG/ML IJ SOLN
30.0000 mg | Freq: Once | INTRAMUSCULAR | Status: AC
  Administered 2019-07-07: 19:00:00 30 mg via INTRAMUSCULAR

## 2019-07-07 MED ORDER — IBUPROFEN 800 MG PO TABS: 800.0000 mg | ORAL_TABLET | Freq: Three times a day (TID) | ORAL | 0 refills | Status: AC

## 2019-07-07 MED ORDER — KETOROLAC TROMETHAMINE 30 MG/ML IJ SOLN
INTRAMUSCULAR | Status: AC
  Filled 2019-07-07: qty 1

## 2019-07-07 MED ORDER — TRAMADOL HCL 50 MG PO TABS: 50.0000 mg | ORAL_TABLET | Freq: Four times a day (QID) | ORAL | 0 refills | Status: AC | PRN

## 2019-07-07 MED ORDER — CLINDAMYCIN HCL 150 MG PO CAPS: 300.0000 mg | ORAL_CAPSULE | Freq: Three times a day (TID) | ORAL | 0 refills | Status: AC

## 2019-07-07 NOTE — Discharge Instructions (Addendum)
Take the clindamycin 3 times a day for 10 days Take the tramadol for severe pain up to every 6 hours, this may make you sleepy so do not drive after taking For moderate pain take 800 mg of ibuprofen and 2 extra strength Tylenol every 8 hours  Follow-up with a dentist for evaluation of your tooth  Follow-up with primary care for continued evaluation and establishment of care

## 2019-07-07 NOTE — ED Provider Notes (Signed)
[facility]    CSN: [id number] Arrival date & time: 07/07/19  1607      History   Chief Complaint Chief Complaint  Patient presents with  . Neck Pain  . Jaw Pain    HPI Evelyn Fields is a 34 y.o. female.   Patient presents with 1 week history of right-sided jaw and neck pain.  She reports the pain is been lancing at times and other times constant.  She reports it starts just below her ear and shoots down the right side of her jaw and her neck.  She reports at times is worse when laying on the side.  She reports sometimes talking and moving her mouth makes this occur.  She also reports pain inside of her ear at times.  She reports having swelling underneath the right side of her jaw.  She reports she does have a history of a bad tooth in the back of her mouth on the right side and has had treatment for this before.  She was recommended to have this removed previously however could not due to childcare issues.  She does not believe this is causing her pain as it is a different presentation for her.  She was last evaluated for this tooth in January 2021.  She was placed on clindamycin and recommended to have dental follow-up.  She reports improvement in her symptoms after that.  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   Denies fever chills.  Denies sore throat.  Denies difficulty swallowing.     Past Medical History:  Diagnosis Date  . Alcohol dependence (HCC) 09/17/2012  . Anxiety   . Asthma   . Headache(784.0)   . Marijuana dependence (HCC) 09/17/2012  . Migraine, unspecified, without mention of intractable migraine without mention of status migrainosus 08/08/2012  . Pyelonephritis   . 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 Shingles 2012, May 2014   left arm    Patient Active Problem List   Diagnosis Date Noted  . Cholelithiasis affecting pregnancy in first  trimester, antepartum 03/04/2016  . Hypokalemia 01/26/2016  . Pyelonephritis affecting pregnancy in first trimester 01/25/2016    Past Surgical History:  Procedure Laterality Date  . 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 Authorizing Provider  acetaminophen (TYLENOL) 500 MG tablet Take 2 tablets (1,000 mg total) by mouth every 6 (six) hours as needed for moderate pain. 07/07/19   Alieu Finnigan, Veryl Speak, PA-C  albuterol (PROVENTIL HFA;VENTOLIN HFA) 108 (90 Base) MCG/ACT inhaler Inhale 1-2 puffs into the lungs every 4 (four) hours as needed for wheezing or shortness of breath.    [provider]  albuterol (PROVENTIL) (2.5 MG/3ML) 0.083% nebulizer solution Inhale 3 mLs into the lungs every 4 (four) hours as needed for wheezing or shortness of breath. 01/29/16  Lazaro Arms, MD  cephALEXin [facility]) 250 MG capsule One tab po qhs for the rest of pregnancy 03/05/16   [place] Bing, MD  cephALEXin (KEFLEX) 500 MG capsule Take 1 capsule (500 mg total) by mouth 4 (four) times daily. 03/09/16   Marny Lowenstein, PA-C  clindamycin (CLEOCIN) 150 MG capsule Take 2 capsules (300 mg total) by mouth 3 (three) times daily for 10 days. 07/07/19 07/17/19  Jezreel Sisk, Veryl Speak, PA-C  ibuprofen (ADVIL) 800 MG tablet Take 1 tablet (800 mg total) by mouth 3 (three) times daily. 07/07/19   Anelisse Jacobson, Veryl Speak, PA-C  oxyCODONE-acetaminophen (PERCOCET/ROXICET) 5-325 MG tablet Take 1 tablet by mouth every 6 (six) hours as needed for severe pain. 03/05/16   [place] Bing, MD  phenazopyridine (PYRIDIUM) 95 MG tablet Take 190 mg by mouth 3 (three) times daily as needed for pain.    [provider]  Prenatal Vit-Fe Fumarate-FA (PRENATAL  MULTIVITAMIN) TABS tablet Take 1 tablet by mouth daily at 12 noon. 03/05/16   [place] Bing, MD  ranitidine (ZANTAC) 150 MG capsule Take 1 capsule (150 mg total) by mouth 2 (two) times daily. 03/05/16   [place] Bing, MD  terconazole (TERAZOL 7) 0.4 % vaginal cream Place 1 applicator vaginally at bedtime. 03/05/16   Jenera Bing, MD  traMADol (ULTRAM) 50 MG tablet Take 1 tablet (50 mg total) by mouth every 6 (six) hours as needed for up to 2 days. 07/07/19 07/09/19  Skylier Kretschmer, Veryl Speak, PA-C  promethazine (PHENERGAN) 25 MG tablet Take 1 tablet (25 mg total) by mouth every 6 (six) hours as needed for nausea or vomiting. Patient not taking: Reported on 03/04/2016 01/25/16 03/04/16  Judeth Horn, NP    Family History Family History  Problem Relation Age of Onset  . Cancer Paternal Grandfather   . Other Neg Hx     Social History Social History   Tobacco Use  . Smoking status: Current Every Day Smoker    Packs/day: 1.00    Years: 3.00    Pack years: 3.00    Types: Cigarettes  . Smokeless tobacco: Never Used  Substance Use Topics  . Alcohol use: Yes    Comment: "before preg"  . Drug use: Yes    Types: Marijuana    Comment: 2 grams daily     Allergies   Imitrex [sumatriptan] and Penicillins   Review of Systems Review of Systems Per HPI  Physical Exam Triage Vital Signs ED Triage Vitals  Enc Vitals Group     BP 07/07/19 1659 125/70     Pulse Rate 07/07/19 1659 73     Resp 07/07/19 1659 18     Temp 07/07/19 1659 98.7 F (37.1 C)     Temp Source 07/07/19 1659 Oral     SpO2 07/07/19 1659 100 %     Weight --      Height --      Head Circumference --      Peak Flow --      Pain Score 07/07/19 1657 10     Pain Loc --      Pain Edu? --      Excl. in [place]? --    No data found.  Updated Vital Signs BP 125/70 (BP Location: Right Arm)   Pulse 73   Temp 98.7 F (37.1 C) (Oral)   Resp 18   SpO2 100%   Visual Acuity Right Eye Distance:   Left Eye Distance:     Bilateral Distance:  Right Eye Near:   Left Eye Near:    Bilateral Near:     Physical Exam Vitals and nursing note reviewed.  Constitutional:      General: She is not in acute distress.    Appearance: Normal appearance. She is well-developed. She is not ill-appearing.  HENT:     Head: Normocephalic and atraumatic.     Right Ear: Tympanic membrane, ear canal and external ear normal.     Left Ear: Tympanic membrane, ear canal and external ear normal.     Mouth/Throat:     Mouth: Mucous membranes are moist.      Comments: Molars with decay and cracking.   Eyes:     Conjunctiva/sclera: Conjunctivae normal.  Neck:     Comments: Tenderness to palpation under the right side of mandible.  Suspect some mild lymphadenopathy in this region.  There is tenderness palpation just posterior to the angle of the mandible. Cardiovascular:     Rate and Rhythm: Normal rate.  Pulmonary:     Effort: Pulmonary effort is normal. No respiratory distress.  Musculoskeletal:     Cervical back: Neck supple.  Skin:    General: Skin is warm and dry.     Findings: No rash.  Neurological:     Mental Status: She is alert and oriented to person, place, and time.      UC Treatments / Results  Labs (all labs ordered are listed, but only abnormal results are displayed) Labs Reviewed - No data to display  EKG   Radiology No results found.  Procedures Procedures (including critical care time)  Medications Ordered in UC Medications  ketorolac (TORADOL) 30 MG/ML injection 30 mg (30 mg Intramuscular Given 07/07/19 1840)    Initial Impression / Assessment and Plan / UC Course  I have reviewed the triage vital signs and the nursing notes.  Pertinent labs & imaging results that were available during my care of the patient were reviewed by me and considered in my medical decision making (see chart for details).     #Dental infection Patient is a 34 year old setting with right-sided jaw and neck  pain is most likely secondary to pain dental infection and decaying tooth.  Given there is a cracked decaying rear molar I believe the pain she is experiencing is likely secondary to this as well as referred pain from the dental issue.  There is no frank abscess however given swelling and tenderness submandibular region believe this warrants initiation of antibiotic therapy.  He has a penicillin allergy so we will start on clindamycin.  Very short course of tramadol followed by ibuprofen.  Instructions to follow-up with a dentist for further evaluation and a primary care option given for further evaluation should her pain and symptoms not reside with this treatment.  Patient verbalizes understanding and agreement with the plan.  Return in follow-up precautions were discussed. Final Clinical Impressions(s) / UC Diagnoses   Final diagnoses:  Dental infection     Discharge Instructions     Take the clindamycin 3 times a day for 10 days Take the tramadol for severe pain up to every 6 hours, this may make you sleepy so do not drive after taking For moderate pain take 800 mg of ibuprofen and 2 extra strength Tylenol every 8 hours  Follow-up with a dentist for evaluation of your tooth  Follow-up with primary care for continued evaluation and establishment of care      ED Prescriptions    Medication Sig Dispense  Auth. Provider   clindamycin (CLEOCIN) 150 MG capsule Take 2 capsules (300 mg total) by mouth 3 (three) times daily for 10 days. 60 capsule Gerald Honea, Veryl Speak, PA-C   traMADol (ULTRAM) 50 MG tablet Take 1 tablet (50 mg total) by mouth every 6 (six) hours as needed for up to 2 days. 6 tablet Leif Loflin, Veryl Speak, PA-C   ibuprofen (ADVIL) 800 MG tablet Take 1 tablet (800 mg total) by mouth 3 (three) times daily. 21 tablet Tasheem Elms, Veryl Speak, PA-C   acetaminophen (TYLENOL) 500 MG tablet Take 2 tablets (1,000 mg total) by mouth every 6 (six) hours as needed for moderate pain. 30 tablet Ayeshia Coppin, Veryl Speak, PA-C       I have reviewed the PDMP during this encounter.   Hermelinda Medicus, PA-C 07/07/19 2311

## 2019-07-07 NOTE — ED Triage Notes (Signed)
Pt reports having right sided jaw pain, right sided neck pain x 1 week. Pt reports the Glossopharyngeal neuralgia pictures she found on Google 'is exactly what I feel"

## 2019-07-23 ENCOUNTER — Encounter (HOSPITAL_COMMUNITY): Payer: Self-pay

## 2019-07-23 ENCOUNTER — Ambulatory Visit (INDEPENDENT_AMBULATORY_CARE_PROVIDER_SITE_OTHER): Payer: Medicaid Other

## 2019-07-23 ENCOUNTER — Other Ambulatory Visit: Payer: Self-pay

## 2019-07-23 ENCOUNTER — Ambulatory Visit (HOSPITAL_COMMUNITY)
Admission: EM | Admit: 2019-07-23 | Discharge: 2019-07-23 | Disposition: A | Discharge status: Home / Self Care | Payer: Medicaid Other | Attending: Family Medicine | Admitting: Family Medicine

## 2019-07-23 DIAGNOSIS — J40 Bronchitis, not specified as acute or chronic: Secondary | ICD-10-CM

## 2019-07-23 MED ORDER — BENZONATATE 200 MG PO CAPS: 200.0000 mg | ORAL_CAPSULE | Freq: Three times a day (TID) | ORAL | 0 refills | Status: DC | PRN

## 2019-07-23 MED ORDER — ALBUTEROL SULFATE (2.5 MG/3ML) 0.083% IN NEBU: 3.0000 mL | INHALATION_SOLUTION | RESPIRATORY_TRACT | 12 refills | Status: AC | PRN

## 2019-07-23 MED ORDER — ALBUTEROL SULFATE HFA 108 (90 BASE) MCG/ACT IN AERS: 1.0000 | INHALATION_SPRAY | RESPIRATORY_TRACT | 1 refills | Status: AC | PRN

## 2019-07-23 MED ORDER — PREDNISONE 10 MG PO TABS: 40.0000 mg | ORAL_TABLET | Freq: Every day | ORAL | 0 refills | Status: AC

## 2019-07-23 NOTE — ED Provider Notes (Signed)
[facility]    CSN: [id number] Arrival date & time: 07/23/19  1240      History   Chief Complaint Chief Complaint  Patient presents with  . Cough  . Back Pain  . Medication Refill    HPI Evelyn Fields is a 34 y.o. female.   Patient is a 34 year old female with past medical history of asthma, anxiety, alcohol dependence, headache, pyelonephritis, depression.  She presents today with productive cough, chest tightness and upper back discomfort.  This is been constant over the past 3 days.  Reporting feels like an asthma flare.  She is currently out of her albuterol inhaler.  Has not take anything for her symptoms.  Mild nasal congestion and rhinorrhea.  Denies any fevers, chills, body aches, night sweats.  No recent sick contacts or recent traveling.  ROS per HPI      Past Medical History:  Diagnosis Date  . Alcohol dependence (HCC) 09/17/2012  . Anxiety   . Asthma   . Headache(784.0)   . Marijuana dependence (HCC) 09/17/2012  . Migraine, unspecified, without mention of intractable migraine without mention of status migrainosus 08/08/2012  . Pyelonephritis   . 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 Shingles 2012, May 2014   left arm    Patient Active Problem List   Diagnosis Date Noted  . Cholelithiasis affecting pregnancy in first trimester, antepartum 03/04/2016  . Hypokalemia 01/26/2016  . Pyelonephritis affecting pregnancy in first trimester 01/25/2016    Past Surgical History:  Procedure Laterality Date  . 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 Authorizing Provider  acetaminophen (TYLENOL) 500 MG tablet Take 2 tablets (1,000  mg total) by mouth every 6 (six) hours as needed for moderate pain. 07/07/19   Darr, Veryl Speak, PA-C  albuterol (PROVENTIL) (2.5 MG/3ML) 0.083% nebulizer solution Inhale 3 mLs into the lungs every 4 (four) hours as needed for wheezing or shortness of breath. 07/23/19   Markez Dowland, Gloris [place] A, NP  albuterol (VENTOLIN HFA) 108 (90 Base) MCG/ACT inhaler Inhale 1-2 puffs into the lungs every 4 (four) hours as needed for wheezing or shortness of breath. 07/23/19   Jorma Tassinari, Gloris [place] A, NP  benzonatate (TESSALON) 200 MG capsule Take 1 capsule (200 mg total) by mouth 3 (three) times daily as needed for cough. 07/23/19   Dahlia Byes A, NP  ibuprofen (ADVIL) 800 MG tablet Take 1 tablet (800 mg total) by mouth 3 (three) times daily. 07/07/19   Darr, Veryl Speak, PA-C  phenazopyridine (PYRIDIUM) 95 MG tablet Take 190 mg by mouth 3 (three) times daily as needed for pain.    [provider]  predniSONE (DELTASONE) 10 MG tablet Take 4 tablets (40 mg total) by mouth daily for 5 days. 07/23/19 07/28/19  Dahlia Byes A, NP  promethazine (PHENERGAN) 25 MG tablet Take 1 tablet (25 mg total) by  mouth every 6 (six) hours as needed for nausea or vomiting. Patient not taking: Reported on 03/04/2016 01/25/16 03/04/16  Jorje Guild, NP  ranitidine (ZANTAC) 150 MG capsule Take 1 capsule (150 mg total) by mouth 2 (two) times daily. 03/05/16 07/23/19  Aletha Halim, MD    Family History Family History  Problem Relation Age of Onset  . Cancer Paternal Grandfather   . Other Neg Hx     Social History Social History   Tobacco Use  . Smoking status: Current Every Day Smoker    Packs/day: 1.00    Years: 3.00    Pack years: 3.00    Types: Cigarettes  . Smokeless tobacco: Never Used  Substance Use Topics  . Alcohol use: Yes    Comment: "before preg"  . Drug use: Yes    Types: Marijuana    Comment: 2 grams daily     Allergies   Imitrex [sumatriptan] and Penicillins   Review of Systems Review of Systems   Physical Exam Triage  Vital Signs ED Triage Vitals  Enc Vitals Group     BP 07/23/19 1408 117/75     Pulse Rate 07/23/19 1408 84     Resp 07/23/19 1408 19     Temp 07/23/19 1408 98.7 F (37.1 C)     Temp Source 07/23/19 1408 Oral     SpO2 07/23/19 1408 100 %     Weight --      Height --      Head Circumference --      Peak Flow --      Pain Score 07/23/19 1406 0     Pain Loc --      Pain Edu? --      Excl. in [place]? --    No data found.  Updated Vital Signs BP 117/75 (BP Location: Right Arm)   Pulse 84   Temp 98.7 F (37.1 C) (Oral)   Resp 19   LMP  (Within Weeks) Comment: 2 weeks  SpO2 100%   Visual Acuity Right Eye Distance:   Left Eye Distance:   Bilateral Distance:    Right Eye Near:   Left Eye Near:    Bilateral Near:     Physical Exam Vitals and nursing note reviewed.  Constitutional:      General: She is not in acute distress.    Appearance: Normal appearance. She is not ill-appearing, toxic-appearing or diaphoretic.  HENT:     Head: Normocephalic.     Nose: Nose normal.  Eyes:     Conjunctiva/sclera: Conjunctivae normal.  Pulmonary:     Effort: Pulmonary effort is normal.     Breath sounds: Wheezing and rhonchi present.     Comments: Decreased lung sounds to right lower lobe Abdominal:     Palpations: Abdomen is soft.  Musculoskeletal:        General: Normal range of motion.     Cervical back: Normal range of motion.  Skin:    General: Skin is warm and dry.     Findings: No rash.  Neurological:     Mental Status: She is alert.  Psychiatric:        Mood and Affect: Mood normal.      UC Treatments / Results  Labs (all labs ordered are listed, but only abnormal results are displayed) Labs Reviewed - No data to display  EKG   Radiology DG Chest 2 View  Result Date: 07/23/2019 CLINICAL DATA:  Three-day history of shortness of breath, wheezing,  productive cough and chest discomfort. Current history of asthma. EXAM: CHEST - 2 VIEW COMPARISON:  07/31/2010.  FINDINGS: Cardiomediastinal silhouette unremarkable and unchanged. Mildly prominent bronchovascular markings diffusely, more so than on the prior exam. Lungs otherwise clear. No localized airspace consolidation. No pleural effusions. No pneumothorax. Normal pulmonary vascularity. Visualized bony thorax unremarkable. IMPRESSION: Mild changes of acute bronchitis and/or asthma without focal airspace pneumonia. Electronically Signed   By: Hulan Saas M.D.   On: 07/23/2019 14:50    Procedures Procedures (including critical care time)  Medications Ordered in UC Medications - No data to display  Initial Impression / Assessment and Plan / UC Course  I have reviewed the triage vital signs and the nursing notes.  Pertinent labs & imaging results that were available during my care of the patient were reviewed by me and considered in my medical decision making (see chart for details).     Bronchitis X-ray without any concerns for pneumonia Treating with prednisone burst over the next 5 days. Albuterol inhaler and nebulizer to use as needed. Sending home with nebulizer machine Tessalon Perles for cough Follow up as needed for continued or worsening symptoms  Final Clinical Impressions(s) / UC Diagnoses   Final diagnoses:  Bronchitis     Discharge Instructions     Treating you for bronchitis Refilled your albuterol inhaler and your nebulizer solution. Use this as needed. Prednisone daily for 5 days Follow up as needed for continued or worsening symptoms     ED Prescriptions    Medication Sig Dispense Auth. Provider   albuterol (VENTOLIN HFA) 108 (90 Base) MCG/ACT inhaler Inhale 1-2 puffs into the lungs every 4 (four) hours as needed for wheezing or shortness of breath. 18 g Jensyn Cambria A, NP   albuterol (PROVENTIL) (2.5 MG/3ML) 0.083% nebulizer solution Inhale 3 mLs into the lungs every 4 (four) hours as needed for wheezing or shortness of breath. 75 mL Marcel Sorter A, NP    predniSONE (DELTASONE) 10 MG tablet Take 4 tablets (40 mg total) by mouth daily for 5 days. 20 tablet Iviana Blasingame A, NP   benzonatate (TESSALON) 200 MG capsule Take 1 capsule (200 mg total) by mouth 3 (three) times daily as needed for cough. 20 capsule Dahlia Byes A, NP     PDMP not reviewed this encounter.   Dahlia Byes A, NP 07/23/19 [phone]

## 2019-07-23 NOTE — ED Triage Notes (Signed)
Pt presents to UC with cough, upper and lower back pain and chest tightness x 3 days. Pt states the chest tightness feels like an asthma flare-up.  Pt have not tried any medication.  Pt requsted albuterol inhaler.

## 2019-07-23 NOTE — Discharge Instructions (Signed)
Treating you for bronchitis Refilled your albuterol inhaler and your nebulizer solution. Use this as needed. Prednisone daily for 5 days Follow up as needed for continued or worsening symptoms

## 2020-01-11 ENCOUNTER — Encounter (HOSPITAL_COMMUNITY): Payer: Self-pay

## 2020-01-11 ENCOUNTER — Ambulatory Visit (HOSPITAL_COMMUNITY)
Admission: EM | Admit: 2020-01-11 | Discharge: 2020-01-11 | Disposition: A | Discharge status: Home / Self Care | Payer: Medicaid Other | Attending: Family Medicine | Admitting: Family Medicine

## 2020-01-11 ENCOUNTER — Other Ambulatory Visit: Payer: Self-pay

## 2020-01-11 DIAGNOSIS — J4521 Mild intermittent asthma with (acute) exacerbation: Secondary | ICD-10-CM | POA: Diagnosis not present

## 2020-01-11 MED ORDER — METHYLPREDNISOLONE SODIUM SUCC 125 MG IJ SOLR
80.0000 mg | Freq: Once | INTRAMUSCULAR | Status: AC
  Administered 2020-01-11: 80 mg via INTRAMUSCULAR

## 2020-01-11 MED ORDER — PREDNISONE 20 MG PO TABS: 20.0000 mg | ORAL_TABLET | Freq: Two times a day (BID) | ORAL | 0 refills | Status: AC

## 2020-01-11 MED ORDER — METHYLPREDNISOLONE ACETATE 80 MG/ML IJ SUSP
INTRAMUSCULAR | Status: AC
  Filled 2020-01-11: qty 1

## 2020-01-11 NOTE — ED Provider Notes (Signed)
[facility]    CSN: [id number] Arrival date & time: 01/11/20  1750      History   Chief Complaint Chief Complaint  Patient presents with  . Shortness of Breath  . Cough    HPI Evelyn Fields is a 34 y.o. female.   HPI   Patient has a history of asthma She had a positive Covid test on 11/26/2019 She is here today with shortness of breath and cough Patient feels like she has a flare of her asthma.  Does not have any fever chills or signs of infection at this time.  No purulent sputum. She has wheezing that is not responding to albuterol by nebulizer or inhaler   Past Medical History:  Diagnosis Date  . Alcohol dependence (HCC) 09/17/2012  . Anxiety   . Asthma   . Headache(784.0)   . Marijuana dependence (HCC) 09/17/2012  . Migraine, unspecified, without mention of intractable migraine without mention of status migrainosus 08/08/2012  . Pyelonephritis   . 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 Shingles 2012, May 2014   left arm    Patient Active Problem List   Diagnosis Date Noted  . Cholelithiasis affecting pregnancy in first trimester, antepartum 03/04/2016  . Hypokalemia 01/26/2016  . Pyelonephritis affecting pregnancy in first trimester 01/25/2016    Past Surgical History:  Procedure Laterality Date  . 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 Authorizing Provider  albuterol (PROVENTIL) (2.5 MG/3ML) 0.083% nebulizer solution Inhale 3 mLs into the lungs every 4 (four) hours as needed for wheezing or shortness of breath. 07/23/19  Yes Bast, Traci A, NP  albuterol (VENTOLIN HFA) 108 (90 Base) MCG/ACT inhaler Inhale 1-2 puffs into the  lungs every 4 (four) hours as needed for wheezing or shortness of breath. 07/23/19  Yes Bast, Traci A, NP  acetaminophen (TYLENOL) 500 MG tablet Take 2 tablets (1,000 mg total) by mouth every 6 (six) hours as needed for moderate pain. 07/07/19   Darr, Gerilyn Pilgrim, PA-C  ibuprofen (ADVIL) 800 MG tablet Take 1 tablet (800 mg total) by mouth 3 (three) times daily. 07/07/19   Darr, Gerilyn Pilgrim, PA-C  predniSONE (DELTASONE) 20 MG tablet Take 1 tablet (20 mg total) by mouth 2 (two) times daily with a meal. 01/11/20   Eustace Moore, MD  promethazine (PHENERGAN) 25 MG tablet Take 1 tablet (25 mg total) by mouth every 6 (six) hours as needed for nausea or vomiting. Patient not taking: Reported on 03/04/2016 01/25/16 03/04/16  Judeth Horn, NP  ranitidine (ZANTAC) 150 MG capsule Take 1 capsule (150 mg total) by mouth 2 (two) times daily. 03/05/16 07/23/19  [place] Bing, MD    Family History Family History  Problem Relation Age of Onset  . Cancer Paternal Grandfather   . Other Neg Hx     Social History Social History   Tobacco  Use  . Smoking status: Current Every Day Smoker    Packs/day: 1.00    Years: 3.00    Pack years: 3.00    Types: Cigarettes  . Smokeless tobacco: Never Used  Substance Use Topics  . Alcohol use: Yes    Comment: "before preg"  . Drug use: Yes    Types: Marijuana    Comment: 2 grams daily     Allergies   Penicillins and Sumatriptan   Review of Systems Review of Systems See HPI  Physical Exam     Updated Vital Signs BP 134/75 (BP Location: Right Arm)   Pulse 92   Temp 98.7 F (37.1 C) (Oral)   Resp 18   LMP 12/28/2019 (Approximate)   SpO2 100%      Physical Exam Constitutional:      General: She is not in acute distress.    Appearance: She is well-developed and normal weight. She is not ill-appearing.  HENT:     Head: Normocephalic and atraumatic.     Right Ear: Tympanic membrane and ear canal normal.     Left Ear: Tympanic membrane and ear canal  normal.     Nose: Nose normal. No congestion.     Mouth/Throat:     Mouth: Mucous membranes are moist.     Pharynx: No posterior oropharyngeal erythema.  Eyes:     Conjunctiva/sclera: Conjunctivae normal.     Pupils: Pupils are equal, round, and reactive to light.  Cardiovascular:     Rate and Rhythm: Normal rate and regular rhythm.     Heart sounds: Normal heart sounds.  Pulmonary:     Effort: Pulmonary effort is normal. No respiratory distress.     Breath sounds: Wheezing present. No rhonchi or rales.     Comments: end inspiratory wheezes Abdominal:     General: There is no distension.     Palpations: Abdomen is soft.  Musculoskeletal:        General: Normal range of motion.     Cervical back: Normal range of motion.  Skin:    General: Skin is warm and dry.  Neurological:     Mental Status: She is alert.  Psychiatric:        Mood and Affect: Mood normal.        Behavior: Behavior normal.      UC Treatments / Results  Labs (all labs ordered are listed, but only abnormal results are displayed) Labs Reviewed - No data to display  EKG   Radiology No results found.  Procedures Procedures (including critical care time)  Medications Ordered in UC Medications  methylPREDNISolone sodium succinate (SOLU-MEDROL) 125 mg/2 mL injection 80 mg (80 mg Intramuscular Given 01/11/20 1836)    Initial Impression / Assessment and Plan / UC Course  I have reviewed the triage vital signs and the nursing notes.  Pertinent labs & imaging results that were available during my care of the patient were reviewed by me and considered in my medical decision making (see chart for details).     Reviewed prednisone use.  Pushing fluids.  Continued albuterol.  Return if fails to improve in a couple days. Final Clinical Impressions(s) / UC Diagnoses   Final diagnoses:  Mild intermittent asthma with acute exacerbation     Discharge Instructions     Drink plenty of fluids Continue  albuterol as needed Take prednisone 2 times a day for 5 days.  Start tomorrow You received an injection of Solu-Medrol.  This is a steroid  to kick start your asthma treatment You may use over-the-counter cough medicine as needed Call or return if not improving in a few days    ED Prescriptions    Medication Sig Dispense Auth. Provider   predniSONE (DELTASONE) 20 MG tablet Take 1 tablet (20 mg total) by mouth 2 (two) times daily with a meal. 10 tablet Eustace Moore, MD     PDMP not reviewed this encounter.   Eustace Moore, MD 01/11/20 1843

## 2020-01-11 NOTE — ED Triage Notes (Signed)
Pt c/o productive cough with white sputum, congestion, SOB and HA for approx 1 week. Pt reports she has had to use her nebulizer frequently 2/2 "chest tightness and feeling short of breath".   Last used nebulizer at approx 1600 today with transient improvement to SOB.  Denies fever, sore throat, runny nose, ear pain, n/v/d, body aches.  Mild inspiratory wheeze to lower lobes, otherwise CTA; harsh cough noted.  Reports she had COVID the end of September, 2021

## 2020-01-11 NOTE — Discharge Instructions (Signed)
Drink plenty of fluids Continue albuterol as needed Take prednisone 2 times a day for 5 days.  Start tomorrow You received an injection of Solu-Medrol.  This is a steroid to kick start your asthma treatment You may use over-the-counter cough medicine as needed Call or return if not improving in a few days

## 2020-05-11 ENCOUNTER — Ambulatory Visit (HOSPITAL_COMMUNITY)
Admission: EM | Admit: 2020-05-11 | Discharge: 2020-05-11 | Disposition: A | Discharge status: Home / Self Care | Payer: Medicaid Other | Attending: Family Medicine | Admitting: Family Medicine

## 2020-05-11 ENCOUNTER — Encounter (HOSPITAL_COMMUNITY): Payer: Self-pay

## 2020-05-11 ENCOUNTER — Ambulatory Visit (INDEPENDENT_AMBULATORY_CARE_PROVIDER_SITE_OTHER): Payer: Medicaid Other

## 2020-05-11 ENCOUNTER — Other Ambulatory Visit: Payer: Self-pay

## 2020-05-11 DIAGNOSIS — N760 Acute vaginitis: Secondary | ICD-10-CM | POA: Diagnosis not present

## 2020-05-11 DIAGNOSIS — M533 Sacrococcygeal disorders, not elsewhere classified: Secondary | ICD-10-CM | POA: Diagnosis not present

## 2020-05-11 MED ORDER — CYCLOBENZAPRINE HCL 5 MG PO TABS: 5.0000 mg | ORAL_TABLET | Freq: Three times a day (TID) | ORAL | 0 refills | Status: AC | PRN

## 2020-05-11 MED ORDER — NAPROXEN 500 MG PO TABS: 500.0000 mg | ORAL_TABLET | Freq: Two times a day (BID) | ORAL | 0 refills | Status: AC | PRN

## 2020-05-11 MED ORDER — METRONIDAZOLE 500 MG PO TABS: 500.0000 mg | ORAL_TABLET | Freq: Two times a day (BID) | ORAL | 0 refills | Status: AC

## 2020-05-11 NOTE — ED Provider Notes (Signed)
[facility]    CSN: [id number] Arrival date & time: 05/11/20  1422      History   Chief Complaint Chief Complaint  Patient presents with  . Vaginitis    HPI Evelyn Fields is a 35 y.o. female.   Patient here today with about a week of vaginal discharge vaginal odor and itching/irritation.  She denies dysuria, hematuria, pelvic pain, fever, chills, nausea, vomiting, flank pain.  Has recently had unprotected sex with a new partner.  Not trying anything over-the-counter for symptoms.  History of trichomonas infection as well as BV and yeast, states this is most consistent with trichomonas for her.  During exam she also notes severe tailbone pain that has been hurting for many months without injury.  Sitting seems to make it worse.  No obvious swelling, drainage, radiation of pain down legs, numbness, tingling, discoloration in the area.  Has not been trying anything for symptoms.     Past Medical History:  Diagnosis Date  . Alcohol dependence (HCC) 09/17/2012  . Anxiety   . Asthma   . Headache(784.0)   . Marijuana dependence (HCC) 09/17/2012  . Migraine, unspecified, without mention of intractable migraine without mention of status migrainosus 08/08/2012  . Pyelonephritis   . 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 Shingles 2012, May 2014   left arm    Patient Active Problem List   Diagnosis Date Noted  . Cholelithiasis affecting pregnancy in first trimester, antepartum 03/04/2016  . Hypokalemia 01/26/2016  . Pyelonephritis affecting pregnancy in first trimester 01/25/2016    Past Surgical History:  Procedure Laterality Date  . 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 Authorizing Provider  cyclobenzaprine (FLEXERIL) 5 MG tablet Take 1 tablet (5 mg total) by mouth 3 (three) times daily as needed for muscle spasms. DO NOT DRINK ALCOHOL OR DRIVE WHILE TAKING THIS MEDICATION 05/11/20  Yes Particia Nearing, PA-C  metroNIDAZOLE (FLAGYL) 500 MG tablet Take 1 tablet (500 mg total) by mouth 2 (two) times daily. 05/11/20  Yes Particia Nearing, PA-C  naproxen (NAPROSYN) 500 MG tablet Take 1 tablet (500 mg total) by mouth 2 (two) times daily as needed. 05/11/20  Yes Particia Nearing, PA-C  acetaminophen (TYLENOL) 500 MG tablet Take 2 tablets (1,000 mg total) by mouth every 6 (six) hours as needed for moderate pain. 07/07/19   Darr, Gerilyn Pilgrim, PA-C  albuterol (PROVENTIL) (2.5 MG/3ML) 0.083% nebulizer solution Inhale 3 mLs into the lungs every 4 (four) hours as needed for wheezing or shortness of breath. 07/23/19   Bast, Gloris [place] A, NP  albuterol (VENTOLIN HFA) 108 (90 Base) MCG/ACT inhaler Inhale 1-2 puffs into the lungs every 4 (four) hours as needed for wheezing or shortness of breath. 07/23/19   Dahlia Byes A, NP  ibuprofen (ADVIL) 800 MG tablet Take 1 tablet (800 mg total) by  mouth 3 (three) times daily. 07/07/19   Darr, Gerilyn Pilgrim, PA-C  predniSONE (DELTASONE) 20 MG tablet Take 1 tablet (20 mg total) by mouth 2 (two) times daily with a meal. 01/11/20   Eustace Moore, MD  promethazine (PHENERGAN) 25 MG tablet Take 1 tablet (25 mg total) by mouth every 6 (six) hours as needed for nausea or vomiting. Patient not taking: Reported on 03/04/2016 01/25/16 03/04/16  Judeth Horn, NP  ranitidine (ZANTAC) 150 MG capsule Take 1 capsule (150 mg total) by mouth 2 (two) times daily. 03/05/16 07/23/19  [place] Bing, MD    Family History Family History  Problem Relation Age of Onset  . Cancer Paternal Grandfather   . Other Neg Hx     Social History Social History   Tobacco Use  . Smoking status: Current Every Day Smoker    Packs/day: 1.00     Years: 3.00    Pack years: 3.00    Types: Cigarettes  . Smokeless tobacco: Never Used  Substance Use Topics  . Alcohol use: Yes    Comment: "before preg"  . Drug use: Yes    Types: Marijuana    Comment: 2 grams daily     Allergies   Penicillins and Sumatriptan   Review of Systems Review of Systems Per HPI  Physical Exam Triage Vital Signs ED Triage Vitals  Enc Vitals Group     BP 05/11/20 1437 114/75     Pulse Rate 05/11/20 1437 97     Resp 05/11/20 1437 18     Temp 05/11/20 1437 98.6 F (37 C)     Temp Source 05/11/20 1437 Oral     SpO2 05/11/20 1437 97 %     Weight --      Height --      Head Circumference --      Peak Flow --      Pain Score 05/11/20 1438 3     Pain Loc --      Pain Edu? --      Excl. in GC? --    No data found.  Updated Vital Signs BP 114/75 (BP Location: Left Arm)   Pulse 97   Temp 98.6 F (37 C) (Oral)   Resp 18   LMP 04/27/2020   SpO2 97%   Visual Acuity Right Eye Distance:   Left Eye Distance:   Bilateral Distance:    Right Eye Near:   Left Eye Near:    Bilateral Near:     Physical Exam Vitals and nursing note reviewed.  Constitutional:      Appearance: Normal appearance. She is not ill-appearing.  HENT:     Head: Atraumatic.     Mouth/Throat:     Mouth: Mucous membranes are moist.     Pharynx: Oropharynx is clear.  Eyes:     Extraocular Movements: Extraocular movements intact.     Conjunctiva/sclera: Conjunctivae normal.  Cardiovascular:     Rate and Rhythm: Normal rate and regular rhythm.     Pulses: Normal pulses.     Heart sounds: Normal heart sounds.  Pulmonary:     Effort: Pulmonary effort is normal.     Breath sounds: Normal breath sounds.  Abdominal:     General: Bowel sounds are normal. There is no distension.     Palpations: Abdomen is soft.     Tenderness: There is no abdominal tenderness. There is no right CVA tenderness, left CVA tenderness or guarding.  Musculoskeletal:  General:  Tenderness present. Normal range of motion.     Cervical back: Normal range of motion and neck supple.     Comments: Mild tenderness to deep palpation in sacral area below superior gluteal crest No weakness bilateral lower extremities, negative straight leg raise bilateral lower extremities.  Skin:    General: Skin is warm and dry.     Findings: No erythema or lesion.  Neurological:     Mental Status: She is alert and oriented to person, place, and time.     Sensory: No sensory deficit.  Psychiatric:        Mood and Affect: Mood normal.        Thought Content: Thought content normal.        Judgment: Judgment normal.      UC Treatments / Results  Labs (all labs ordered are listed, but only abnormal results are displayed) Labs Reviewed  CERVICOVAGINAL ANCILLARY ONLY - Abnormal; Notable for the following components:      Result Value   Bacterial Vaginitis (gardnerella) Positive (*)    All other components within normal limits    EKG   Radiology No results found.  Procedures Procedures (including critical care time)  Medications Ordered in UC Medications - No data to display  Initial Impression / Assessment and Plan / UC Course  I have reviewed the triage vital signs and the nursing notes.  Pertinent labs & imaging results that were available during my care of the patient were reviewed by me and considered in my medical decision making (see chart for details).     Vitals and exam reassuring today, UA benign, vaginal swab pending.  Sacral x-ray without acute bony abnormality.  Suspect muscular pain and will trial Flexeril and naproxen along with good stretches.  Start Flagyl while awaiting results of vaginal swab given her history of trichomonas and BV.  Discussed abstinence from intercourse until results are back from these.  PCP follow-up for recheck.  Final Clinical Impressions(s) / UC Diagnoses   Final diagnoses:  Acute vaginitis  Sacral pain   Discharge  Instructions   None    ED Prescriptions    Medication Sig Dispense Auth. Provider   cyclobenzaprine (FLEXERIL) 5 MG tablet Take 1 tablet (5 mg total) by mouth 3 (three) times daily as needed for muscle spasms. DO NOT DRINK ALCOHOL OR DRIVE WHILE TAKING THIS MEDICATION 15 tablet Particia Nearing, PA-C   naproxen (NAPROSYN) 500 MG tablet Take 1 tablet (500 mg total) by mouth 2 (two) times daily as needed. 15 tablet Particia Nearing, [place]   metroNIDAZOLE (FLAGYL) 500 MG tablet Take 1 tablet (500 mg total) by mouth 2 (two) times daily. 14 tablet Particia Nearing, [place]     PDMP not reviewed this encounter.   Particia Nearing, [place] 05/14/20 1119

## 2020-05-11 NOTE — ED Triage Notes (Signed)
Pt presents with vaginal discomfort, odor, and discharge for over a week.

## 2020-05-13 LAB — CERVICOVAGINAL ANCILLARY ONLY
Bacterial Vaginitis (gardnerella): POSITIVE — AB
Candida Glabrata: NEGATIVE
Candida Vaginitis: NEGATIVE
Chlamydia: NEGATIVE
Comment: NEGATIVE
Comment: NEGATIVE
Comment: NEGATIVE
Comment: NEGATIVE
Comment: NEGATIVE
Comment: NORMAL
Neisseria Gonorrhea: NEGATIVE
Trichomonas: NEGATIVE
